# Patient Record
Sex: Female | Born: 1960 | Race: White | Hispanic: No | Marital: Married | Smoking: Never smoker
Health system: Southern US, Community
[De-identification: ages and names within clinical notes are randomized; demographics above are authoritative.]

## PROBLEM LIST (undated history)

## (undated) DIAGNOSIS — T7840XA Allergy, unspecified, initial encounter: Secondary | ICD-10-CM

## (undated) DIAGNOSIS — F32A Depression, unspecified: Secondary | ICD-10-CM

## (undated) DIAGNOSIS — F329 Major depressive disorder, single episode, unspecified: Secondary | ICD-10-CM

## (undated) HISTORY — DX: Allergy, unspecified, initial encounter: T78.40XA

## (undated) HISTORY — DX: Major depressive disorder, single episode, unspecified: F32.9

## (undated) HISTORY — DX: Depression, unspecified: F32.A

## (undated) MED FILL — Iron Sucrose Inj 20 MG/ML (Fe Equiv): INTRAVENOUS | Qty: 10 | Status: AC

---
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
## 1988-12-29 DIAGNOSIS — O99814 Abnormal glucose complicating childbirth: Secondary | ICD-10-CM | POA: Insufficient documentation

## 1999-12-30 HISTORY — PX: ABDOMINAL HYSTERECTOMY: SHX81

## 2008-09-07 DIAGNOSIS — E669 Obesity, unspecified: Secondary | ICD-10-CM | POA: Insufficient documentation

## 2008-09-07 DIAGNOSIS — E668 Other obesity: Secondary | ICD-10-CM | POA: Insufficient documentation

## 2009-05-26 DIAGNOSIS — J309 Allergic rhinitis, unspecified: Secondary | ICD-10-CM | POA: Insufficient documentation

## 2014-09-25 LAB — HM PAP SMEAR: HM Pap smear: NEGATIVE

## 2015-06-07 ENCOUNTER — Telehealth: Payer: Self-pay | Admitting: *Deleted

## 2015-06-07 NOTE — Telephone Encounter (Signed)
Opened in error

## 2015-06-15 ENCOUNTER — Other Ambulatory Visit: Payer: Self-pay | Admitting: Family Medicine

## 2015-06-15 DIAGNOSIS — F4322 Adjustment disorder with anxiety: Secondary | ICD-10-CM

## 2015-06-18 DIAGNOSIS — F4320 Adjustment disorder, unspecified: Secondary | ICD-10-CM | POA: Insufficient documentation

## 2015-06-19 ENCOUNTER — Telehealth: Payer: Self-pay | Admitting: Family Medicine

## 2015-06-19 NOTE — Telephone Encounter (Signed)
Pt called requesting refills. After letting the pt go I noticed Dr. Caryn Section approved refills on 06/18/15. I called pt back to advise. Thanks TNP

## 2015-09-26 ENCOUNTER — Ambulatory Visit (INDEPENDENT_AMBULATORY_CARE_PROVIDER_SITE_OTHER): Payer: BLUE CROSS/BLUE SHIELD | Admitting: Family Medicine

## 2015-09-26 ENCOUNTER — Encounter: Payer: Self-pay | Admitting: Family Medicine

## 2015-09-26 VITALS — BP 134/76 | HR 84 | Temp 98.2°F | Resp 16 | Ht 62.0 in | Wt 257.0 lb

## 2015-09-26 DIAGNOSIS — Z23 Encounter for immunization: Secondary | ICD-10-CM | POA: Diagnosis not present

## 2015-09-26 DIAGNOSIS — Z0000 Encounter for general adult medical examination without abnormal findings: Secondary | ICD-10-CM | POA: Diagnosis not present

## 2015-09-26 DIAGNOSIS — IMO0002 Reserved for concepts with insufficient information to code with codable children: Secondary | ICD-10-CM | POA: Insufficient documentation

## 2015-09-26 DIAGNOSIS — Z1211 Encounter for screening for malignant neoplasm of colon: Secondary | ICD-10-CM | POA: Diagnosis not present

## 2015-09-26 DIAGNOSIS — B029 Zoster without complications: Secondary | ICD-10-CM | POA: Insufficient documentation

## 2015-09-26 DIAGNOSIS — Z1239 Encounter for other screening for malignant neoplasm of breast: Secondary | ICD-10-CM

## 2015-09-26 DIAGNOSIS — R202 Paresthesia of skin: Secondary | ICD-10-CM | POA: Insufficient documentation

## 2015-09-26 LAB — POCT URINALYSIS DIPSTICK
Bilirubin, UA: NEGATIVE
Glucose, UA: NEGATIVE
Ketones, UA: NEGATIVE
Leukocytes, UA: NEGATIVE
NITRITE UA: NEGATIVE
PROTEIN UA: NEGATIVE
RBC UA: NEGATIVE
SPEC GRAV UA: 1.025
UROBILINOGEN UA: 0.2
pH, UA: 6

## 2015-09-26 NOTE — Patient Instructions (Signed)
Please call the [facility] at [facility] to schedule this at [phone]

## 2015-09-26 NOTE — Progress Notes (Signed)
Patient ID: Teresa Pitts, female   DOB: Oct 21, 1961, 54 y.o.   MRN: [id number]        Patient: Teresa Pitts, Female    DOB: 1961-10-09, 54 y.o.   MRN: [id number] Visit Date: 09/26/2015  Today's Provider: Margarita Rana, MD   Chief Complaint  Patient presents with  . Annual Exam   Subjective:    Annual physical exam [name] is a 54 y.o. female who presents today for health maintenance and complete physical. She feels well. She reports not exercising due to knee pain. She reports she is sleeping well.  Mood stable. A lot of family stress. Untrue information in daughter's EHR about patient, that is unclear how got there. Daughter going thru difficult divorce.    09/25/14 CPE 09/25/14 Pap-neg 05/03/12 EKG  Results for orders placed or performed in visit on 09/26/15  POCT urinalysis dipstick  Result Value Ref Range   Color, UA straw    Clarity, UA clear    Glucose, UA neg    Bilirubin, UA neg    Ketones, UA neg    Spec Grav, UA 1.025    Blood, UA neg    pH, UA 6.0    Protein, UA neg    Urobilinogen, UA 0.2    Nitrite, UA neg    Leukocytes, UA Negative Negative    -----------------------------------------------------------------   Review of Systems  Constitutional: Negative.   HENT: Negative.   Eyes: Negative.   Respiratory: Negative.   Cardiovascular: Negative.   Gastrointestinal: Negative.   Endocrine: Negative.   Genitourinary: Negative.   Musculoskeletal: Positive for joint swelling and arthralgias (knee).  Skin: Negative.   Allergic/Immunologic: Negative.   Neurological: Negative.   Hematological: Negative.   Psychiatric/Behavioral: Negative.     Social History She  reports that she has never smoked. She has never used smokeless tobacco. She reports that she does not drink alcohol or use illicit drugs. Social History   Social History  . Marital Status: Married    Spouse Name: N/A  . Number of Children: N/A  . Years of Education: N/A   Social History  Main Topics  . Smoking status: Never Smoker   . Smokeless tobacco: Never Used  . Alcohol Use: No  . Drug Use: No  . Sexual Activity: Not Asked   Other Topics Concern  . None   Social History Narrative    Patient Active Problem List   Diagnosis Date Noted  . Body mass index of 60 or higher 09/26/2015  . Burning or prickling sensation 09/26/2015  . Herpes zona 09/26/2015  . Adjustment reaction 06/18/2015  . Allergic rhinitis 05/26/2009  . Extreme obesity 09/07/2008  . Abnormal maternal glucose tolerance, with delivery 12/29/1988    Past Surgical History  Procedure Laterality Date  . Abdominal hysterectomy  2001  . Tonsillectomy and adenoidectomy  1980    Family History  Family Status  Relation Status Death Age  . Mother Alive   . Father Alive   . Brother Deceased     suicide  . Daughter Alive   . Son Alive    Her family history includes Cancer in her mother; Diabetes in her father; Hypertension in her father.    No Known Allergies  Previous Medications   IBUPROFEN (ADVIL,MOTRIN) 200 MG TABLET    Take by mouth as needed.   LORATADINE (CLARITIN) 10 MG CAPS    CLARITIN, 10MG (Oral Capsule)  1 Every Day, As Needed for 0 days  Quantity: 0.00;  Refills:  0   Ordered :04-Oct-2010  Edmonia James ;  Started 31-Aug-2008 Active Comments: Medication taken as needed. DX: 477.9   VENLAFAXINE XR (EFFEXOR-XR) 75 MG 24 HR CAPSULE    take 1 capsule by mouth once daily    Patient Care Team: Margarita Rana, MD as PCP - General (Family Medicine)     Objective:   Vitals: BP 134/76 mmHg  Pulse 84  Temp(Src) 98.2 F (36.8 C) (Oral)  Resp 16  Ht 5' 2"  (1.575 m)  Wt 257 lb (116.574 kg)  BMI 46.99 kg/m2   Physical Exam  Constitutional: She is oriented to person, place, and time. She appears well-developed and well-nourished.  HENT:  Head: Normocephalic and atraumatic.  Right Ear: Tympanic membrane, external ear and ear canal normal.  Left Ear: Tympanic membrane,  external ear and ear canal normal.  Nose: Nose normal.  Mouth/Throat: Uvula is midline, oropharynx is clear and moist and mucous membranes are normal.  Eyes: Conjunctivae, EOM and lids are normal. Pupils are equal, round, and reactive to light.  Neck: Trachea normal and normal range of motion. Neck supple. Carotid bruit is not present. No thyroid mass and no thyromegaly present.  Cardiovascular: Normal rate, regular rhythm and normal heart sounds.   Pulmonary/Chest: Effort normal and breath sounds normal.  Abdominal: Soft. Normal appearance and bowel sounds are normal. There is no hepatosplenomegaly. There is no tenderness.  Genitourinary: No breast swelling, tenderness or discharge.  Musculoskeletal: Normal range of motion.  Lymphadenopathy:    She has no cervical adenopathy.    She has no axillary adenopathy.  Neurological: She is alert and oriented to person, place, and time. She has normal strength. No cranial nerve deficit.  Skin: Skin is warm, dry and intact.  Psychiatric: She has a normal mood and affect. Her speech is normal and behavior is normal. Judgment and thought content normal. Cognition and memory are normal.     Depression Screen PHQ 2/9 Scores 09/26/2015  PHQ - 2 Score 0   History  Alcohol Use No    Assessment & Plan:     Routine Health Maintenance and Physical Exam  Exercise Activities and Dietary recommendations Goals    . Exercise 150 minutes per week (moderate activity)       Immunization History  Administered Date(s) Administered  . Influenza,inj,Quad PF,36+ Mos 09/26/2015  . Tdap 09/03/2007    Health Maintenance  Topic Date Due  . Hepatitis C Screening  07/[birth year]/1962  . HIV Screening  10/31/1977  . TETANUS/TDAP  10/31/1981  . PAP SMEAR  11/01/1983  . MAMMOGRAM  10/31/2012  . COLONOSCOPY  10/31/2012  . INFLUENZA VACCINE  07/30/2015   1. Annual physical exam Healthy. Increase exercise and check labs as below.   - POCT urinalysis dipstick -  Comprehensive metabolic panel - CBC with Differential/Platelet - Hemoglobin A1c - Lipid Panel With LDL/HDL Ratio - TSH  2. Breast cancer screening Will schedule.  - MM DIGITAL SCREENING BILATERAL; Future  3. Colon cancer screening Will refer.  - Ambulatory referral to Gastroenterology  4. Need for influenza vaccination Stable.  - Flu Vaccine QUAD 36+ mos IM   Patient was seen and examined by Jerrell Belfast, MD, and note scribed by Lynford Humphrey, [place].   I have reviewed the document for accuracy and completeness and I agree with above. Jerrell Belfast, MD    Margarita Rana, MD     --------------------------------------------------------------------

## 2015-11-26 DIAGNOSIS — M170 Bilateral primary osteoarthritis of knee: Secondary | ICD-10-CM | POA: Insufficient documentation

## 2015-12-11 ENCOUNTER — Other Ambulatory Visit: Payer: Self-pay | Admitting: Family Medicine

## 2015-12-11 DIAGNOSIS — F4322 Adjustment disorder with anxiety: Secondary | ICD-10-CM

## 2016-06-07 ENCOUNTER — Other Ambulatory Visit: Payer: Self-pay | Admitting: Family Medicine

## 2016-06-07 DIAGNOSIS — F4322 Adjustment disorder with anxiety: Secondary | ICD-10-CM

## 2016-06-11 ENCOUNTER — Other Ambulatory Visit: Payer: Self-pay | Admitting: Family Medicine

## 2016-06-11 DIAGNOSIS — F4322 Adjustment disorder with anxiety: Secondary | ICD-10-CM

## 2016-06-11 MED ORDER — VENLAFAXINE HCL ER 75 MG PO CP24: 75.0000 mg | ORAL_CAPSULE | Freq: Every day | ORAL | Status: DC

## 2016-06-11 NOTE — Telephone Encounter (Signed)
Pt needs refill for her venlafaxine XR (EFFEXOR-XR) 75 MG 24 hr capsule  She uses [facility]  Her call back is [phone]  She has an appt. With Sonia Baller Sept. 29th for CPE  Thanks teri

## 2016-09-26 ENCOUNTER — Encounter: Payer: BLUE CROSS/BLUE SHIELD | Admitting: Physician Assistant

## 2016-10-03 ENCOUNTER — Encounter: Payer: BLUE CROSS/BLUE SHIELD | Admitting: Physician Assistant

## 2017-01-01 ENCOUNTER — Ambulatory Visit (INDEPENDENT_AMBULATORY_CARE_PROVIDER_SITE_OTHER): Payer: BLUE CROSS/BLUE SHIELD | Admitting: Physician Assistant

## 2017-01-01 ENCOUNTER — Encounter: Payer: Self-pay | Admitting: Physician Assistant

## 2017-01-01 VITALS — BP 120/80 | HR 74 | Temp 98.6°F | Resp 16 | Ht 62.5 in | Wt 246.0 lb

## 2017-01-01 DIAGNOSIS — M7712 Lateral epicondylitis, left elbow: Secondary | ICD-10-CM | POA: Insufficient documentation

## 2017-01-01 DIAGNOSIS — Z114 Encounter for screening for human immunodeficiency virus [HIV]: Secondary | ICD-10-CM

## 2017-01-01 DIAGNOSIS — Z1211 Encounter for screening for malignant neoplasm of colon: Secondary | ICD-10-CM

## 2017-01-01 DIAGNOSIS — M67449 Ganglion, unspecified hand: Secondary | ICD-10-CM | POA: Insufficient documentation

## 2017-01-01 DIAGNOSIS — Z0000 Encounter for general adult medical examination without abnormal findings: Secondary | ICD-10-CM

## 2017-01-01 DIAGNOSIS — J3089 Other allergic rhinitis: Secondary | ICD-10-CM | POA: Diagnosis not present

## 2017-01-01 DIAGNOSIS — F4322 Adjustment disorder with anxiety: Secondary | ICD-10-CM

## 2017-01-01 DIAGNOSIS — Z1231 Encounter for screening mammogram for malignant neoplasm of breast: Secondary | ICD-10-CM | POA: Diagnosis not present

## 2017-01-01 DIAGNOSIS — N3941 Urge incontinence: Secondary | ICD-10-CM | POA: Insufficient documentation

## 2017-01-01 DIAGNOSIS — Z1159 Encounter for screening for other viral diseases: Secondary | ICD-10-CM

## 2017-01-01 DIAGNOSIS — Z1239 Encounter for other screening for malignant neoplasm of breast: Secondary | ICD-10-CM

## 2017-01-01 MED ORDER — VENLAFAXINE HCL ER 75 MG PO CP24: 75.0000 mg | ORAL_CAPSULE | Freq: Every day | ORAL | 5 refills | Status: DC

## 2017-01-01 NOTE — Patient Instructions (Signed)

## 2017-01-01 NOTE — Progress Notes (Signed)
Patient: Teresa Pitts, Female    DOB: Jul 14, 1961, 56 y.o.   MRN: [id number] Visit Date: 01/01/2017  Today's Provider: Mar Daring, PA-C   Chief Complaint  Patient presents with  . Annual Exam   Subjective:    Annual physical exam Teresa Pitts is a 56 y.o. female who presents today for health maintenance and complete physical. She feels well. She reports exercising none. She reports she is sleeping well.  09/25/14 Pap-neg Mammogram: overdue Colonoscopy: Never  Patient also has acute complaints of leftt elbow and forearm pain. 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 She has not done anything for this yet. She also mentions having increasing urinary frequency with urge incontinence. This has been worsening over the last couple of months as well. She also has a bump on the distal phalanx of the thumb on the right hand. She noticed this a couple of months back. Occasionally hurts or becomes more swollen if she bumps it. It is also starting to affect the nail growing out, now has a flat ridge in the area where the bump is located more proximally. She also has chronic knee pain from OA and is starting an exercise program for weight loss. She has seen ortho at [facility] for this in the past but was told she is not a candidate for any procedure unless she lost 40 pounds.  -----------------------------------------------------------------   Review of Systems  Constitutional: Negative.   HENT: Negative.   Eyes: Negative.   Respiratory: Negative.   Cardiovascular: Negative.   Gastrointestinal: Negative.   Endocrine: Negative.   Genitourinary: Negative.   Musculoskeletal: Positive for arthralgias.       Arm pain and knee pain  Skin: Negative.        Lesion on right thumb times several weeks  Allergic/Immunologic: Negative.   Neurological: Negative.   Hematological: Negative.   Psychiatric/Behavioral: Negative.     Social  History      She  reports that she has never smoked. She has never used smokeless tobacco. She reports that she does not drink alcohol or use drugs.       Social History   Social History  . Marital status: Married    Spouse name: N/A  . Number of children: 2  . Years of education: N/A   Social History Main Topics  . Smoking status: Never Smoker  . Smokeless tobacco: Never Used  . Alcohol use No  . Drug use: No  . Sexual activity: Not Asked   Other Topics Concern  . None   Social History Narrative  . None    Past Medical History:  Diagnosis Date  . Allergy   . Depression      Patient Active Problem List   Diagnosis Date Noted  . Primary osteoarthritis of both knees 11/26/2015  . Body mass index of 60 or higher 09/26/2015  . Burning or prickling sensation 09/26/2015  . Herpes zona 09/26/2015  . Adjustment reaction 06/18/2015  . Allergic rhinitis 05/26/2009  . Extreme obesity ([place]) 09/07/2008  . Abnormal maternal glucose tolerance, with delivery 12/29/1988    Past Surgical History:  Procedure Laterality Date  . ABDOMINAL HYSTERECTOMY  2001  . TONSILLECTOMY AND ADENOIDECTOMY  1980    Family History        Family Status  Relation Status  . Mother Alive  . Father Alive  . Brother Deceased   suicide  .  Daughter Alive  . Son Alive        Her family history includes Cancer in her mother; Diabetes in her father; Hypertension in her father.     No Known Allergies   Current Outpatient Prescriptions:  .  ibuprofen (ADVIL,MOTRIN) 200 MG tablet, Take by mouth as needed., Disp: , Rfl:  .  Loratadine (CLARITIN) 10 MG CAPS, CLARITIN, 10MG (Oral Capsule)  1 Every Day, As Needed for 0 days  Quantity: 0.00;  Refills: 0   Ordered :04-Oct-2010  Edmonia James ;  Started 31-Aug-2008 Active Comments: Medication taken as needed. DX: 477.9, Disp: , Rfl:  .  venlafaxine XR (EFFEXOR-XR) 75 MG 24 hr capsule, Take 1 capsule (75 mg total) by mouth daily., Disp: 30 capsule,  Rfl: 3   Patient Care Team: Mar Daring, PA-C as PCP - General (Family Medicine)      Objective:   Vitals: BP 120/80 (BP Location: Right Arm, Patient Position: Sitting, Cuff Size: Large)   Pulse 74   Temp 98.6 F (37 C) (Oral)   Resp 16   Ht 5' 2.5" (1.588 m)   Wt 246 lb (111.6 kg)   BMI 44.28 kg/m    Physical Exam  Constitutional: She is oriented to person, place, and time. She appears well-developed and well-nourished. No distress.  HENT:  Head: Normocephalic and atraumatic.  Right Ear: Hearing, tympanic membrane, external ear and ear canal normal.  Left Ear: Hearing, tympanic membrane, external ear and ear canal normal.  Nose: Nose normal.  Mouth/Throat: Uvula is midline, oropharynx is clear and moist and mucous membranes are normal. No oropharyngeal exudate.  Eyes: Conjunctivae and EOM are normal. Pupils are equal, round, and reactive to light. Right eye exhibits no discharge. Left eye exhibits no discharge. No scleral icterus.  Neck: Normal range of motion. Neck supple. No JVD present. Carotid bruit is not present. No tracheal deviation present. No thyromegaly present.  Cardiovascular: Normal rate, regular rhythm, normal heart sounds and intact distal pulses.  Exam reveals no gallop and no friction rub.   No murmur heard. Pulmonary/Chest: Effort normal and breath sounds normal. No respiratory distress. She has no decreased breath sounds. She has no wheezes. She has no rales. She exhibits no tenderness. Right breast exhibits no inverted nipple, no mass, no nipple discharge, no skin change and no tenderness. Left breast exhibits no inverted nipple, no mass, no nipple discharge, no skin change and no tenderness. Breasts are symmetrical.  Abdominal: Soft. Bowel sounds are normal. She exhibits no distension and no mass. There is no tenderness. There is no rebound and no guarding.  Musculoskeletal: Normal range of motion. She exhibits no edema.       Right elbow: Normal.       Left elbow: She exhibits normal range of motion and no swelling. Tenderness found. Lateral epicondyle tenderness noted.       Hands: Lymphadenopathy:    She has no cervical adenopathy.  Neurological: She is alert and oriented to person, place, and time.  Skin: Skin is warm and dry. No rash noted. She is not diaphoretic.  Psychiatric: She has a normal mood and affect. Her behavior is normal. Judgment and thought content normal.  Vitals reviewed.    Depression Screen PHQ 2/9 Scores 01/01/2017 09/26/2015  PHQ - 2 Score 0 0      Assessment & Plan:     Routine Health Maintenance and Physical Exam  Exercise Activities and Dietary recommendations Goals    . Exercise 150 minutes  per week (moderate activity)       Immunization History  Administered Date(s) Administered  . Influenza Split 12/12/2010  . Influenza,inj,Quad PF,36+ Mos 09/25/2014, 09/26/2015  . Tdap 09/03/2007    Health Maintenance  Topic Date Due  . Hepatitis C Screening  July 19, 1961  . HIV Screening  10/31/1977  . MAMMOGRAM  10/31/2012  . COLONOSCOPY  10/31/2012  . INFLUENZA VACCINE  07/29/2016  . TETANUS/TDAP  09/02/2017  . PAP SMEAR  09/25/2017     Discussed health benefits of physical activity, and encouraged her to engage in regular exercise appropriate for her age and condition.    1. Annual physical exam Normal physical exam today. Will check labs as below and f/u pending lab results. If labs are stable and WNL she will not need to have these rechecked for one year at her next annual physical exam. She is to call the office in the meantime if she has any acute issue, questions or concerns. - Comprehensive metabolic panel - Lipid Panel With LDL/HDL Ratio - Hemoglobin A1c  2. Breast cancer screening Breast exam today was normal. There is no family history of breast cancer. She does perform regular self breast exams. Mammogram was ordered as below. Information for [facility] was given to  patient so she may schedule her mammogram at her convenience. - MM DIGITAL SCREENING BILATERAL; Future  3. Colon cancer screening No previous colonoscopy. Referral placed as below.  - Ambulatory referral to Gastroenterology  4. Acute allergic rhinitis due to other allergen, unspecified seasonality Will check labs as below and f/u pending results. - CBC with Differential/Platelet  5. Adjustment disorder with anxious mood Stable. Diagnosis pulled for medication refill. Continue current medical treatment plan. Will check labs as below and f/u pending results. - TSH - venlafaxine XR (EFFEXOR-XR) 75 MG 24 hr capsule; Take 1 capsule (75 mg total) by mouth daily.  Dispense: 30 capsule; Refill: 5  6. Need for hepatitis C screening test - Hepatitis C antibody  7. Screening for HIV (human immunodeficiency virus) - HIV antibody (with reflex)  8. Digital mucous cyst Will refer to orthopedics since it is affecting the nail bed and occasionally causes pain for further evaluation.  - Ambulatory referral to Orthopedic Surgery  9. Urge incontinence of urine Discussed time toileting with trying to use the restroom every 2 hours. If this does not help may consider pelvic floor therapy as discussed in office visit.  10. Left lateral epicondylitis IBU 654m TID or aleve 4490mBID (patient choice) OTC, rest, ice, heat, stretches shown to patient, elbow strap. She is to call if no improvements.  --------------------------------------------------------------------    JeMar DaringPA-C  [facility]

## 2017-01-02 ENCOUNTER — Telehealth: Payer: Self-pay

## 2017-01-02 ENCOUNTER — Encounter: Payer: Self-pay | Admitting: Physician Assistant

## 2017-01-02 DIAGNOSIS — R7303 Prediabetes: Secondary | ICD-10-CM | POA: Insufficient documentation

## 2017-01-02 DIAGNOSIS — E7800 Pure hypercholesterolemia, unspecified: Secondary | ICD-10-CM | POA: Insufficient documentation

## 2017-01-02 LAB — COMPREHENSIVE METABOLIC PANEL
ALBUMIN: 4.4 g/dL (ref 3.5–5.5)
ALK PHOS: 55 IU/L (ref 39–117)
ALT: 24 IU/L (ref 0–32)
AST: 23 IU/L (ref 0–40)
Albumin/Globulin Ratio: 1.7 (ref 1.2–2.2)
BUN/Creatinine Ratio: 14 (ref 9–23)
BUN: 10 mg/dL (ref 6–24)
Bilirubin Total: 0.4 mg/dL (ref 0.0–1.2)
CHLORIDE: 100 mmol/L (ref 96–106)
CO2: 25 mmol/L (ref 18–29)
CREATININE: 0.73 mg/dL (ref 0.57–1.00)
Calcium: 9.4 mg/dL (ref 8.7–10.2)
GFR calc Af Amer: 108 mL/min/{1.73_m2} (ref >59)
GFR calc non Af Amer: 94 mL/min/{1.73_m2} (ref >59)
Globulin, Total: 2.6 g/dL (ref 1.5–4.5)
Glucose: 91 mg/dL (ref 65–99)
Potassium: 4.8 mmol/L (ref 3.5–5.2)
Sodium: 142 mmol/L (ref 134–144)
Total Protein: 7 g/dL (ref 6.0–8.5)

## 2017-01-02 LAB — CBC WITH DIFFERENTIAL/PLATELET
BASOS ABS: 0 10*3/uL (ref 0.0–0.2)
Basos: 0 %
EOS (ABSOLUTE): 0.2 10*3/uL (ref 0.0–0.4)
Eos: 3 %
HEMOGLOBIN: 14.3 g/dL (ref 11.1–15.9)
Hematocrit: 41.9 % (ref 34.0–46.6)
Immature Grans (Abs): 0 10*3/uL (ref 0.0–0.1)
Immature Granulocytes: 0 %
Lymphocytes Absolute: 1.5 10*3/uL (ref 0.7–3.1)
Lymphs: 27 %
MCH: 28.6 pg (ref 26.6–33.0)
MCHC: 34.1 g/dL (ref 31.5–35.7)
MCV: 84 fL (ref 79–97)
MONOCYTES: 5 %
Monocytes Absolute: 0.3 10*3/uL (ref 0.1–0.9)
Neutrophils Absolute: 3.6 10*3/uL (ref 1.4–7.0)
Neutrophils: 65 %
Platelets: 226 10*3/uL (ref 150–379)
RBC: 5 x10E6/uL (ref 3.77–5.28)
RDW: 13.3 % (ref 12.3–15.4)
WBC: 5.5 10*3/uL (ref 3.4–10.8)

## 2017-01-02 LAB — HEMOGLOBIN A1C
ESTIMATED AVERAGE GLUCOSE: 117 mg/dL
HEMOGLOBIN A1C: 5.7 % — AB (ref 4.8–5.6)

## 2017-01-02 LAB — LIPID PANEL WITH LDL/HDL RATIO
Cholesterol, Total: 265 mg/dL — ABNORMAL HIGH (ref 100–199)
HDL: 92 mg/dL (ref >39)
LDL Calculated: 153 mg/dL — ABNORMAL HIGH (ref 0–99)
LDL/HDL RATIO: 1.7 ratio (ref 0.0–3.2)
Triglycerides: 101 mg/dL (ref 0–149)
VLDL Cholesterol Cal: 20 mg/dL (ref 5–40)

## 2017-01-02 LAB — HEPATITIS C ANTIBODY

## 2017-01-02 LAB — TSH: TSH: 2.35 u[IU]/mL (ref 0.450–4.500)

## 2017-01-02 LAB — HIV ANTIBODY (ROUTINE TESTING W REFLEX): HIV SCREEN 4TH GENERATION: NONREACTIVE

## 2017-01-02 NOTE — Telephone Encounter (Signed)
-----   Message from Mar Daring, [place] sent at 01/02/2017  8:46 AM EST ----- Cholesterol is elevated but HDL is high also offering cardioprotection. ASCVD risk over a 10 yr period is low at 1.29% so no need to start cholesterol lowering medication at this time. Work on lifestyle modifications including healthy dieting (limiting fats and carbs) and increasing physical activity. Fish oil 1228m or mega red may help lower cholesterol some also along with lifestyle changes. All other labs are stable and WNL.

## 2017-01-02 NOTE — Telephone Encounter (Signed)
Advised pt of lab results. Pt verbally acknowledges understanding. Kym Scannell Drozdowski, CMA   

## 2017-01-06 ENCOUNTER — Encounter: Payer: BLUE CROSS/BLUE SHIELD | Admitting: Physician Assistant

## 2017-01-14 ENCOUNTER — Other Ambulatory Visit: Payer: Self-pay

## 2017-01-14 ENCOUNTER — Telehealth: Payer: Self-pay

## 2017-01-14 NOTE — Telephone Encounter (Signed)
Gastroenterology Pre-Procedure Review  Request Date:  Requesting Physician: Dr.   PATIENT REVIEW QUESTIONS: The patient responded to the following health history questions as indicated:    1. Are you having any GI issues? no 2. Do you have a personal history of Polyps? no 3. Do you have a family history of Colon Cancer or Polyps? no 4. Diabetes Mellitus? no 5. Joint replacements in the past 12 months?no 6. Major health problems in the past 3 months?no 7. Any artificial heart valves, MVP, or defibrillator?no    MEDICATIONS & ALLERGIES:    Patient reports the following regarding taking any anticoagulation/antiplatelet therapy:   Plavix, Coumadin, Eliquis, Xarelto, Lovenox, Pradaxa, Brilinta, or Effient? no Aspirin? no  Patient confirms/reports the following medications:  Current Outpatient Prescriptions  Medication Sig Dispense Refill  . ibuprofen (ADVIL,MOTRIN) 200 MG tablet Take by mouth as needed.    . Loratadine (CLARITIN) 10 MG CAPS CLARITIN, 10MG (Oral Capsule)  1 Every Day, As Needed for 0 days  Quantity: 0.00;  Refills: 0   Ordered :04-Oct-2010  Teresa Pitts ;  Started 31-Aug-2008 Active Comments: Medication taken as needed. DX: 477.9    . venlafaxine XR (EFFEXOR-XR) 75 MG 24 hr capsule Take 1 capsule (75 mg total) by mouth daily. 30 capsule 5   No current facility-administered medications for this visit.     Patient confirms/reports the following allergies:  No Known Allergies  No orders of the defined types were placed in this encounter.   AUTHORIZATION INFORMATION Primary Insurance: 1D#: Group #:  Secondary Insurance: 1D#: Group #:  SCHEDULE INFORMATION: Date: 02/27/17 Time: Location: [place]

## 2017-02-25 ENCOUNTER — Telehealth: Payer: Self-pay | Admitting: Gastroenterology

## 2017-02-25 NOTE — Telephone Encounter (Signed)
Patient left a voice message that she needs to reschedule her colonoscopy. Please call

## 2017-02-26 ENCOUNTER — Encounter: Payer: Self-pay | Admitting: *Deleted

## 2017-02-26 NOTE — Telephone Encounter (Signed)
Called pt and left message to callback for reschedule of procedure.

## 2017-02-27 ENCOUNTER — Ambulatory Visit
Admission: RE | Admit: 2017-02-27 | Payer: BLUE CROSS/BLUE SHIELD | Source: Ambulatory Visit | Admitting: Gastroenterology

## 2017-02-27 ENCOUNTER — Encounter: Admission: RE | Payer: Self-pay | Source: Ambulatory Visit

## 2017-02-27 SURGERY — COLONOSCOPY WITH PROPOFOL
Anesthesia: General

## 2017-05-27 ENCOUNTER — Encounter: Payer: Self-pay | Admitting: Physician Assistant

## 2017-05-27 ENCOUNTER — Ambulatory Visit (INDEPENDENT_AMBULATORY_CARE_PROVIDER_SITE_OTHER): Payer: BLUE CROSS/BLUE SHIELD | Admitting: Physician Assistant

## 2017-05-27 VITALS — BP 102/60 | HR 83 | Temp 98.2°F | Resp 16 | Wt 258.8 lb

## 2017-05-27 DIAGNOSIS — K5732 Diverticulitis of large intestine without perforation or abscess without bleeding: Secondary | ICD-10-CM

## 2017-05-27 DIAGNOSIS — R197 Diarrhea, unspecified: Secondary | ICD-10-CM

## 2017-05-27 DIAGNOSIS — R10817 Generalized abdominal tenderness: Secondary | ICD-10-CM

## 2017-05-27 MED ORDER — DICYCLOMINE HCL 10 MG PO CAPS: 10.0000 mg | ORAL_CAPSULE | Freq: Three times a day (TID) | ORAL | 0 refills | Status: DC

## 2017-05-27 NOTE — Patient Instructions (Signed)
Bland Diet A bland diet consists of foods that do not have a lot of fat or fiber. Foods without fat or fiber are easier for the body to digest. They are also less likely to irritate your mouth, throat, stomach, and other parts of your gastrointestinal tract. A bland diet is sometimes called a BRAT diet. What is my plan? Your health care provider or dietitian may recommend specific changes to your diet to prevent and treat your symptoms, such as:  Eating small meals often.  Cooking food until it is soft enough to chew easily.  Chewing your food well.  Drinking fluids slowly.  Not eating foods that are very spicy, sour, or fatty.  Not eating citrus fruits, such as oranges and grapefruit. What do I need to know about this diet?  Eat a variety of foods from the bland diet food list.  Do not follow a bland diet longer than you have to.  Ask your health care provider whether you should take vitamins. What foods can I eat? Grains   Hot cereals, such as cream of wheat. Bread, crackers, or tortillas made from refined white flour. Rice. Vegetables  Canned or cooked vegetables. Mashed or boiled potatoes. Fruits  Bananas. Applesauce. Other types of cooked or canned fruit with the skin and seeds removed, such as canned peaches or pears. Meats and Other Protein Sources  Scrambled eggs. Creamy peanut butter or other nut butters. Lean, well-cooked meats, such as chicken or fish. Tofu. Soups or broths. Dairy  Low-fat dairy products, such as milk, cottage cheese, or yogurt. Beverages  Water. Herbal tea. Apple juice. Sweets and Desserts  Pudding. Custard. Fruit gelatin. Ice cream. Fats and Oils  Mild salad dressings. Canola or olive oil. The items listed above may not be a complete list of allowed foods or beverages. Contact your dietitian for more options.  What foods are not recommended? Foods and ingredients that are often not recommended include:  Spicy foods, such as hot sauce or  salsa.  Fried foods.  Sour foods, such as pickled or fermented foods.  Raw vegetables or fruits, especially citrus or berries.  Caffeinated drinks.  Alcohol.  Strongly flavored seasonings or condiments. The items listed above may not be a complete list of foods and beverages that are not allowed. Contact your dietitian for more information.  This information is not intended to replace advice given to you by your health care provider. Make sure you discuss any questions you have with your health care provider. Document Released: 04/07/2016 Document Revised: 05/22/2016 Document Reviewed: 12/27/2014 Elsevier Interactive Patient Education  2017 [employer].

## 2017-05-27 NOTE — Progress Notes (Signed)
Patient: Teresa Pitts Female    DOB: 11/02/1961   56 y.o.   MRN: [id number] Visit Date: 05/27/2017  Today's Provider: Mar Daring, PA-C   Chief Complaint  Patient presents with  . Diarrhea   Subjective:    Diarrhea   This is a new problem. The current episode started 1 to 4 weeks ago (for three weeks). The problem occurs 5 to 10 times per day (Depending on what she eats). The problem has been unchanged. The stool consistency is described as watery. Associated symptoms include bloating. Pertinent negatives include no coughing, fever, headaches, increased  flatus, vomiting or weight loss. Associated symptoms comments: Associated Symptoms:Cramping sometimes, fatigue. Dizzy and nauseas. Exacerbated by: "anything"  There are no known risk factors. She has tried bismuth subsalicylate for the symptoms. The treatment provided no relief.  She reports that her stomach feels better after she eats, but then starts to bother her.    No Known Allergies   Current Outpatient Prescriptions:  .  ibuprofen (ADVIL,MOTRIN) 200 MG tablet, Take by mouth as needed., Disp: , Rfl:  .  Loratadine (CLARITIN) 10 MG CAPS, CLARITIN, 10MG (Oral Capsule)  1 Every Day, As Needed for 0 days  Quantity: 0.00;  Refills: 0   Ordered :04-Oct-2010  Edmonia James ;  Started 31-Aug-2008 Active Comments: Medication taken as needed. DX: 477.9, Disp: , Rfl:  .  venlafaxine XR (EFFEXOR-XR) 75 MG 24 hr capsule, Take 1 capsule (75 mg total) by mouth daily., Disp: 30 capsule, Rfl: 5  Review of Systems  Constitutional: Positive for fatigue. Negative for fever and weight loss.  Respiratory: Negative for cough.   Cardiovascular: Negative for chest pain, palpitations and leg swelling.  Gastrointestinal: Positive for abdominal distention, bloating, diarrhea and nausea. Negative for blood in stool, constipation, flatus, rectal pain and vomiting.  Genitourinary: Negative.   Musculoskeletal: Positive for back pain.    Neurological: Positive for dizziness. Negative for light-headedness and headaches.    Social History  Substance Use Topics  . Smoking status: Never Smoker  . Smokeless tobacco: Never Used  . Alcohol use No   Objective:   BP 102/60 (BP Location: Right Wrist, Patient Position: Sitting, Cuff Size: Normal)   Pulse 83   Temp 98.2 F (36.8 C) (Oral)   Resp 16   Wt 258 lb 12.8 oz (117.4 kg)   SpO2 97%   BMI 46.58 kg/m    Physical Exam  Constitutional: She appears well-developed and well-nourished. No distress.  HENT:  Head: Normocephalic and atraumatic.  Right Ear: Hearing, tympanic membrane, external ear and ear canal normal.  Left Ear: Hearing, tympanic membrane, external ear and ear canal normal.  Nose: Nose normal.  Mouth/Throat: Uvula is midline, oropharynx is clear and moist and mucous membranes are normal. No oropharyngeal exudate.  Eyes: Conjunctivae are normal. Pupils are equal, round, and reactive to light. Right eye exhibits no discharge. Left eye exhibits no discharge. No scleral icterus.  Neck: Normal range of motion. Neck supple. No tracheal deviation present. No thyromegaly present.  Cardiovascular: Normal rate, regular rhythm and normal heart sounds.  Exam reveals no gallop and no friction rub.   No murmur heard. Pulmonary/Chest: Effort normal and breath sounds normal. No stridor. No respiratory distress. She has no wheezes. She has no rales.  Abdominal: Normal appearance. She exhibits no ascites and no mass. Bowel sounds are increased. There is generalized tenderness. There is no rigidity, no rebound, no guarding and no CVA tenderness. No hernia.  Lymphadenopathy:    She has no cervical adenopathy.  Skin: Skin is warm and dry. She is not diaphoretic.  Vitals reviewed.       Assessment & Plan:     1. Diarrhea, unspecified type Suspect functional diarrhea vs viral gastroenteritis as the most likely causes. Advised to follow bland diet and use immodium prn.  Bentyl given as below for cramping and to see if it lessens diarrhea symptoms. Will check labs as below and obtain stool cultures as below. No recent travel, camping or exposure to anyone with similar symptoms. I will f/u pending results. Will refer to GI if all testing is normal. I will see her back in 1-2 weeks to recheck. She is to call if symptoms change or worsen in the meantime.  - CBC w/Diff/Platelet - Stool Culture - Ova and parasite examination - DG Abd 2 Views; Future - dicyclomine (BENTYL) 10 MG capsule; Take 1 capsule (10 mg total) by mouth 4 (four) times daily -  before meals and at bedtime.  Dispense: 40 capsule; Refill: 0 - Stool C-Diff Toxin Assay  2. Generalized abdominal tenderness without rebound tenderness See above medical treatment plan. - dicyclomine (BENTYL) 10 MG capsule; Take 1 capsule (10 mg total) by mouth 4 (four) times daily -  before meals and at bedtime.  Dispense: 40 capsule; Refill: 0 - Stool C-Diff Toxin Assay       Mar Daring, PA-C  [facility]

## 2017-05-28 ENCOUNTER — Telehealth: Payer: Self-pay

## 2017-05-28 LAB — CBC WITH DIFFERENTIAL/PLATELET
BASOS ABS: 0 10*3/uL (ref 0.0–0.2)
Basos: 0 %
EOS (ABSOLUTE): 0.1 10*3/uL (ref 0.0–0.4)
Eos: 2 %
HEMATOCRIT: 39.4 % (ref 34.0–46.6)
Hemoglobin: 13.1 g/dL (ref 11.1–15.9)
Immature Grans (Abs): 0.1 10*3/uL (ref 0.0–0.1)
Immature Granulocytes: 2 %
LYMPHS ABS: 1.9 10*3/uL (ref 0.7–3.1)
Lymphs: 36 %
MCH: 27.2 pg (ref 26.6–33.0)
MCHC: 33.2 g/dL (ref 31.5–35.7)
MCV: 82 fL (ref 79–97)
MONOS ABS: 0.5 10*3/uL (ref 0.1–0.9)
Monocytes: 9 %
Neutrophils Absolute: 2.7 10*3/uL (ref 1.4–7.0)
Neutrophils: 51 %
Platelets: 226 10*3/uL (ref 150–379)
RBC: 4.81 x10E6/uL (ref 3.77–5.28)
RDW: 13.1 % (ref 12.3–15.4)
WBC: 5.3 10*3/uL (ref 3.4–10.8)

## 2017-05-28 NOTE — Telephone Encounter (Signed)
Advised pt of lab results. Pt verbally acknowledges understanding. Emily Drozdowski, CMA   

## 2017-05-28 NOTE — Telephone Encounter (Signed)
-----   Message from Mar Daring, [place] sent at 05/28/2017  8:25 AM EDT ----- No signs of infection noted with changes in WBC. It is stable and WNL. Will await other results.

## 2017-06-03 ENCOUNTER — Telehealth: Payer: Self-pay

## 2017-06-03 LAB — STOOL CULTURE: E coli, Shiga toxin Assay: NEGATIVE

## 2017-06-03 LAB — OVA AND PARASITE EXAMINATION

## 2017-06-03 LAB — CLOSTRIDIUM DIFFICILE EIA: C difficile Toxins A+B, EIA: NEGATIVE

## 2017-06-03 NOTE — Telephone Encounter (Signed)
-----   Message from Mar Daring, [place] sent at 06/03/2017 12:59 PM EDT ----- All stool cultures have been negative. Can we see if she is still having diarrhea.

## 2017-06-03 NOTE — Telephone Encounter (Signed)
Yes I would get that next.

## 2017-06-03 NOTE — Telephone Encounter (Signed)
Patient advised as directed below. Per patient she is going to go get xray Thursday or Friday.

## 2017-06-03 NOTE — Telephone Encounter (Signed)
Patient advised as directed below. She reports still having diarrhea. She is asking if she still need to get the xray?

## 2017-06-05 ENCOUNTER — Ambulatory Visit
Admission: RE | Admit: 2017-06-05 | Discharge: 2017-06-05 | Disposition: A | Discharge status: Home / Self Care | Payer: BLUE CROSS/BLUE SHIELD | Source: Ambulatory Visit | Attending: Physician Assistant | Admitting: Physician Assistant

## 2017-06-05 DIAGNOSIS — R197 Diarrhea, unspecified: Secondary | ICD-10-CM | POA: Insufficient documentation

## 2017-06-09 ENCOUNTER — Telehealth: Payer: Self-pay

## 2017-06-09 DIAGNOSIS — R197 Diarrhea, unspecified: Secondary | ICD-10-CM

## 2017-06-09 DIAGNOSIS — Z1211 Encounter for screening for malignant neoplasm of colon: Secondary | ICD-10-CM

## 2017-06-09 DIAGNOSIS — R1032 Left lower quadrant pain: Secondary | ICD-10-CM

## 2017-06-09 NOTE — Telephone Encounter (Signed)
I would recommend CT abd/pelvis to evaluate for diverticulitis. Is she having fever?   I will order CT.

## 2017-06-09 NOTE — Telephone Encounter (Signed)
Patient called stating she continues to have problems with diarrhea.  Now also having issues with constipation and pain in her lower left quadrant.   Does not know if she needs to be seen, get referral or other. CB # [phone]

## 2017-06-09 NOTE — Telephone Encounter (Signed)
Patient advised as below. Patient verbalizes understanding and is in agreement with treatment plan. Patient wants to know if you recommend her having a colonoscopy, reports she has not had one done.

## 2017-06-10 NOTE — Telephone Encounter (Signed)
Patient advised and agrees with recommendation.

## 2017-06-10 NOTE — Telephone Encounter (Signed)
Noted  

## 2017-06-10 NOTE — Telephone Encounter (Signed)
Yes she should. I can order GI referral as well.  I will say if she ends up having diverticulitis she will not be able to get a colonoscopy until treated and symptom free for normally 8 weeks at minimum.

## 2017-06-12 ENCOUNTER — Telehealth: Payer: Self-pay

## 2017-06-12 ENCOUNTER — Ambulatory Visit
Admission: RE | Admit: 2017-06-12 | Discharge: 2017-06-12 | Disposition: A | Discharge status: Home / Self Care | Payer: BLUE CROSS/BLUE SHIELD | Source: Ambulatory Visit | Attending: Physician Assistant | Admitting: Physician Assistant

## 2017-06-12 DIAGNOSIS — R1032 Left lower quadrant pain: Secondary | ICD-10-CM | POA: Diagnosis present

## 2017-06-12 DIAGNOSIS — K5732 Diverticulitis of large intestine without perforation or abscess without bleeding: Secondary | ICD-10-CM | POA: Insufficient documentation

## 2017-06-12 DIAGNOSIS — K760 Fatty (change of) liver, not elsewhere classified: Secondary | ICD-10-CM | POA: Diagnosis not present

## 2017-06-12 DIAGNOSIS — R197 Diarrhea, unspecified: Secondary | ICD-10-CM | POA: Diagnosis not present

## 2017-06-12 MED ORDER — SULFAMETHOXAZOLE-TRIMETHOPRIM 800-160 MG PO TABS: 1.0000 | ORAL_TABLET | Freq: Two times a day (BID) | ORAL | 0 refills | Status: DC

## 2017-06-12 NOTE — Telephone Encounter (Signed)
-----   Message from Mar Daring, [place] sent at 06/12/2017 11:26 AM EDT ----- There is some diverticulitis noted. Will treat with antibiotic. This will be sent in to pharmacy on file. Please call if no improvement.

## 2017-06-12 NOTE — Addendum Note (Signed)
Addended by: Mar Daring on: 06/12/2017 11:29 AM   Modules accepted: Orders

## 2017-06-12 NOTE — Telephone Encounter (Signed)
Patient advised as below. Patient verbalizes understanding and is in agreement with treatment plan.  

## 2017-07-13 ENCOUNTER — Encounter: Payer: Self-pay | Admitting: Gastroenterology

## 2017-07-13 ENCOUNTER — Ambulatory Visit (INDEPENDENT_AMBULATORY_CARE_PROVIDER_SITE_OTHER): Payer: BLUE CROSS/BLUE SHIELD | Admitting: Gastroenterology

## 2017-07-13 ENCOUNTER — Telehealth: Payer: Self-pay

## 2017-07-13 ENCOUNTER — Other Ambulatory Visit: Payer: Self-pay

## 2017-07-13 VITALS — BP 119/75 | HR 71 | Temp 98.2°F | Ht 62.5 in | Wt 253.2 lb

## 2017-07-13 DIAGNOSIS — K529 Noninfective gastroenteritis and colitis, unspecified: Secondary | ICD-10-CM | POA: Diagnosis not present

## 2017-07-13 DIAGNOSIS — R1030 Lower abdominal pain, unspecified: Secondary | ICD-10-CM | POA: Diagnosis not present

## 2017-07-13 DIAGNOSIS — R197 Diarrhea, unspecified: Secondary | ICD-10-CM

## 2017-07-13 NOTE — Progress Notes (Signed)
Jonathon Bellows MD, MRCP(U.K) [address]  [place]  [place], [place] [address]  Main: [phone]  Fax: [phone]   Gastroenterology Consultation  Referring Provider:     Florian Buff* Primary Care Physician:  Mar Daring, PA-C Primary Gastroenterologist:  Dr. Jonathon Bellows  Reason for Consultation:     Diarrhea         HPI:   Teresa Pitts is a 56 y.o. y/o female referred for consultation & management  by . Mar Daring, PA-C.   Labs 04/2017- Hb 13.1,  She has been referred for abdominal pain and diarrhea.  CT abdomen 06/12/17- sigmoid colon diverticuloses , hepatic steatosis.   C diff, stool cultures, OP-negative.   Diarrhea :  Onset: ongoing since 04/2017 , since has been better, was put on a course of diverticulitis and treated with "one antibiotics" for 7 days , after which she felt better in terms of decrease in pain but the diarrhea didn't stopped.    Number of bowel movements a day : 4+ times a day - can range from 3-4 to 10 a day    Color : not sure   Consistency:  Watery  Present status: ongoing    Shape of stool:  When she had a solid bowel movement was long and narrow several weeks back    Weight loss:  No  Prior colonoscopy:  Never   Artificial sugars/sodas/chewing gum:  1 diet mountain dew a day , chews 2-4 sticks a day (conatins mannitol and xylitol), consumes splenda and sweet n low (4-6 sachets a day ) , crystalite 2 quarts of prepared juice.    Bloating:  Yes - when she passes gas it smells a lot   Gas:  Yes  Antibiotic use: yes  Does have abdominal bloating , and has abdominal pain when she is bloating all over her lower abdomen which is better after a bowel movement or after passing gas.    No issues with dairy . No family history of colon cancer or polyps. Not on any NSAID's at this time.   Past Medical History:  Diagnosis Date  . Allergy   . Depression     Past Surgical History:  Procedure Laterality Date  .  ABDOMINAL HYSTERECTOMY  2001  . TONSILLECTOMY AND ADENOIDECTOMY  1980    Prior to Admission medications   Medication Sig Start Date End Date Taking? Authorizing Provider  meloxicam (MOBIC) 15 MG tablet Take by mouth. 11/08/14  Yes [provider]  dicyclomine (BENTYL) 10 MG capsule Take 1 capsule (10 mg total) by mouth 4 (four) times daily -  before meals and at bedtime. 05/27/17   Mar Daring, PA-C  HYDROcodone-acetaminophen (NORCO/VICODIN) 5-325 MG tablet hydrocodone 5 mg-acetaminophen 325 mg tablet    [provider]  ibuprofen (ADVIL,MOTRIN) 200 MG tablet Take by mouth as needed.    [provider]  Loratadine (CLARITIN) 10 MG CAPS CLARITIN, 10MG (Oral Capsule)  1 Every Day, As Needed for 0 days  Quantity: 0.00;  Refills: 0   Ordered :04-Oct-2010  Edmonia James ;  Started 31-Aug-2008 Active Comments: Medication taken as needed. DX: 477.9 08/31/08   [provider]  ondansetron (ZOFRAN-ODT) 4 MG disintegrating tablet ondansetron 4 mg disintegrating tablet    [provider]  oxycodone (OXY-IR) 5 MG capsule oxycodone 5 mg tablet    [provider]  sulfamethoxazole-trimethoprim (BACTRIM DS,SEPTRA DS) 800-160 MG tablet Take 1 tablet by mouth 2 (two) times daily.  06/12/17   Mar Daring, PA-C  venlafaxine XR (EFFEXOR-XR) 75 MG 24 hr capsule Take 1 capsule (75 mg total) by mouth daily. 01/01/17   Mar Daring, PA-C    Family History  Problem Relation Age of Onset  . Cancer Mother        uterus  . Diabetes Father   . Hypertension Father      Social History  Substance Use Topics  . Smoking status: Never Smoker  . Smokeless tobacco: Never Used  . Alcohol use No    Allergies as of 07/13/2017  . (No Known Allergies)    Review of Systems:    All systems reviewed and negative except where noted in HPI.   Physical Exam:  There were no vitals taken for this visit. No LMP recorded. Patient has had a  hysterectomy. Psych:  Alert and cooperative. Normal mood and affect. General:   Alert,  Well-developed, well-nourished, pleasant and cooperative in NAD Head:  Normocephalic and atraumatic. Eyes:  Sclera clear, no icterus.   Conjunctiva pink. Ears:  Normal auditory acuity. Nose:  No deformity, discharge, or lesions. Mouth:  No deformity or lesions,oropharynx pink & moist. Neck:  Supple; no masses or thyromegaly. Lungs:  Respirations even and unlabored.  Clear throughout to auscultation.   No wheezes, crackles, or rhonchi. No acute distress. Heart:  Regular rate and rhythm; no murmurs, clicks, rubs, or gallops. Abdomen:  Normal bowel sounds.  No bruits.  Soft, non-tender and non-distended without masses, hepatosplenomegaly or hernias noted.  No guarding or rebound tenderness.    Pulses:  Normal pulses noted. Extremities:  No clubbing or edema.  No cyanosis. Neurologic:  Alert and oriented x3;  grossly normal neurologically. Marland Kitchen Psych:  Alert and cooperative. Normal mood and affect.  Imaging Studies: No results found.  Assessment and Plan:   Teresa Pitts is a 56 y.o. y/o female has been referred for diarrhea and abdominal pain. Her history suggests a consumption of a very large qty of artificial sugars in her diet which can all cause an osmotic diarrhea. Her abdominal pain is episodic and occurs when she is very bloated which in turn is due to the fermentation of these artificial sugars. She has never had a colonoscopy .   Plan  1. Stop all artificials sugars in diet and see how her dirrhea reacts over the next few weeks 2. Colonoscopy for change in bowel habits.  3. If no better after d/c artificial sugars then will screen for celiac disease and r/o SIBO  I have discussed alternative options, risks & benefits,  which include, but are not limited to, bleeding, infection, perforation,respiratory complication & drug reaction.  The patient agrees with this plan & written consent will be obtained.      Follow up in 3 months   Dr Jonathon Bellows MD,MRCP(U.K)

## 2017-07-13 NOTE — Telephone Encounter (Signed)
Gastroenterology Pre-Procedure Review  Request Date: 9/6 Requesting Physician: Dr. Vicente Males  PATIENT REVIEW QUESTIONS: The patient responded to the following health history questions as indicated:    1. Are you having any GI issues? yes (diarrhea) 2. Do you have a personal history of Polyps? no 3. Do you have a family history of Colon Cancer or Polyps? no 4. Diabetes Mellitus? no 5. Joint replacements in the past 12 months?no 6. Major health problems in the past 3 months?no 7. Any artificial heart valves, MVP, or defibrillator?no    MEDICATIONS & ALLERGIES:    Patient reports the following regarding taking any anticoagulation/antiplatelet therapy:   Plavix, Coumadin, Eliquis, Xarelto, Lovenox, Pradaxa, Brilinta, or Effient? no Aspirin? no  Patient confirms/reports the following medications:  Current Outpatient Prescriptions  Medication Sig Dispense Refill  . dicyclomine (BENTYL) 10 MG capsule Take 1 capsule (10 mg total) by mouth 4 (four) times daily -  before meals and at bedtime. (Patient not taking: Reported on 07/13/2017) 40 capsule 0  . HYDROcodone-acetaminophen (NORCO/VICODIN) 5-325 MG tablet hydrocodone 5 mg-acetaminophen 325 mg tablet    . ibuprofen (ADVIL,MOTRIN) 200 MG tablet Take by mouth as needed.    . Loratadine (CLARITIN) 10 MG CAPS CLARITIN, 10MG (Oral Capsule)  1 Every Day, As Needed for 0 days  Quantity: 0.00;  Refills: 0   Ordered :04-Oct-2010  Teresa Pitts ;  Started 31-Aug-2008 Active Comments: Medication taken as needed. DX: 477.9    . meloxicam (MOBIC) 15 MG tablet Take by mouth.    . ondansetron (ZOFRAN-ODT) 4 MG disintegrating tablet ondansetron 4 mg disintegrating tablet    . oxycodone (OXY-IR) 5 MG capsule oxycodone 5 mg tablet    . venlafaxine XR (EFFEXOR-XR) 75 MG 24 hr capsule Take 1 capsule (75 mg total) by mouth daily. 30 capsule 5   No current facility-administered medications for this visit.     Patient confirms/reports the following  allergies:  No Known Allergies  No orders of the defined types were placed in this encounter.   AUTHORIZATION INFORMATION Primary Insurance: 1D#: Group #:  Secondary Insurance: 1D#: Group #:  SCHEDULE INFORMATION: Date: 9/6 Time: Location: [facility]

## 2017-07-21 ENCOUNTER — Ambulatory Visit: Payer: BLUE CROSS/BLUE SHIELD | Admitting: Gastroenterology

## 2017-09-01 ENCOUNTER — Telehealth: Payer: Self-pay

## 2017-09-01 NOTE — Telephone Encounter (Signed)
Patient contacted office to rescheduled colonoscopy from 09/03/17 to 09/24/17 due to family emergency.  Elta Teresa Pitts has been contacted to move procedure date in Endo. Thanks [employer]

## 2017-09-22 ENCOUNTER — Other Ambulatory Visit: Payer: Self-pay | Admitting: Physician Assistant

## 2017-09-22 DIAGNOSIS — F4322 Adjustment disorder with anxiety: Secondary | ICD-10-CM

## 2017-09-24 ENCOUNTER — Ambulatory Visit: Payer: BLUE CROSS/BLUE SHIELD | Admitting: Anesthesiology

## 2017-09-24 ENCOUNTER — Ambulatory Visit
Admission: RE | Admit: 2017-09-24 | Discharge: 2017-09-24 | Disposition: A | Discharge status: Home / Self Care | Payer: BLUE CROSS/BLUE SHIELD | Source: Ambulatory Visit | Attending: Gastroenterology | Admitting: Gastroenterology

## 2017-09-24 ENCOUNTER — Encounter
Admission: RE | Disposition: A | Discharge status: Home / Self Care | Payer: Self-pay | Source: Ambulatory Visit | Attending: Gastroenterology

## 2017-09-24 DIAGNOSIS — Z79891 Long term (current) use of opiate analgesic: Secondary | ICD-10-CM | POA: Diagnosis not present

## 2017-09-24 DIAGNOSIS — F329 Major depressive disorder, single episode, unspecified: Secondary | ICD-10-CM | POA: Diagnosis not present

## 2017-09-24 DIAGNOSIS — K6389 Other specified diseases of intestine: Secondary | ICD-10-CM | POA: Diagnosis not present

## 2017-09-24 DIAGNOSIS — Z791 Long term (current) use of non-steroidal anti-inflammatories (NSAID): Secondary | ICD-10-CM | POA: Insufficient documentation

## 2017-09-24 DIAGNOSIS — K529 Noninfective gastroenteritis and colitis, unspecified: Secondary | ICD-10-CM | POA: Diagnosis not present

## 2017-09-24 DIAGNOSIS — Z79899 Other long term (current) drug therapy: Secondary | ICD-10-CM | POA: Insufficient documentation

## 2017-09-24 DIAGNOSIS — Z6841 Body Mass Index (BMI) 40.0 and over, adult: Secondary | ICD-10-CM | POA: Insufficient documentation

## 2017-09-24 DIAGNOSIS — R197 Diarrhea, unspecified: Secondary | ICD-10-CM

## 2017-09-24 HISTORY — PX: COLONOSCOPY WITH PROPOFOL: SHX5780

## 2017-09-24 SURGERY — COLONOSCOPY WITH PROPOFOL
Anesthesia: General

## 2017-09-24 MED ORDER — PROPOFOL 10 MG/ML IV BOLUS
INTRAVENOUS | Status: DC | PRN
  Administered 2017-09-24: 100 mg via INTRAVENOUS

## 2017-09-24 MED ORDER — PROPOFOL 500 MG/50ML IV EMUL
INTRAVENOUS | Status: DC | PRN
  Administered 2017-09-24: 160 ug/kg/min via INTRAVENOUS

## 2017-09-24 MED ORDER — LIDOCAINE 2% (20 MG/ML) 5 ML SYRINGE
INTRAMUSCULAR | Status: DC | PRN
  Administered 2017-09-24: 40 mg via INTRAVENOUS

## 2017-09-24 MED ORDER — PROPOFOL 500 MG/50ML IV EMUL
INTRAVENOUS | Status: AC
  Filled 2017-09-24: qty 50

## 2017-09-24 MED ORDER — FENTANYL CITRATE (PF) 100 MCG/2ML IJ SOLN
INTRAMUSCULAR | Status: AC
  Filled 2017-09-24: qty 2

## 2017-09-24 MED ORDER — SODIUM CHLORIDE 0.9 % IV SOLN
INTRAVENOUS | Status: DC
  Administered 2017-09-24 (×2): via INTRAVENOUS

## 2017-09-24 MED ORDER — FENTANYL CITRATE (PF) 100 MCG/2ML IJ SOLN
INTRAMUSCULAR | Status: DC | PRN
  Administered 2017-09-24 (×2): 50 ug via INTRAVENOUS

## 2017-09-24 NOTE — Transfer of Care (Signed)
Immediate Anesthesia Transfer of Care Note  Patient: Teresa Pitts  Procedure(s) Performed: Procedure(s): COLONOSCOPY WITH PROPOFOL (N/A)  Patient Location: PACU and Endoscopy Unit  Anesthesia Type:General  Level of Consciousness: drowsy  Airway & Oxygen Therapy: Patient Spontanous Breathing and Patient connected to nasal cannula oxygen  Post-op Assessment: Report given to RN and Post -op Vital signs reviewed and stable  Post vital signs: Reviewed and stable  Last Vitals:  Vitals:   09/24/17 1022  Pulse: 71  Resp: 18  Temp: 36.6 C  SpO2: 97%    Last Pain:  Vitals:   09/24/17 1022  TempSrc: Tympanic         Complications: No apparent anesthesia complications

## 2017-09-24 NOTE — Anesthesia Preprocedure Evaluation (Signed)
Anesthesia Evaluation  Patient identified by MRN, date of birth, ID band Patient awake    Reviewed: Allergy & Precautions, H&P , NPO status , Patient's Chart, lab work & pertinent test results, reviewed documented beta blocker date and time   Airway Mallampati: II   Neck ROM: full    Dental  (+) Poor Dentition   Pulmonary neg pulmonary ROS,    Pulmonary exam normal        Cardiovascular negative cardio ROS Normal cardiovascular exam Rhythm:regular Rate:Normal     Neuro/Psych PSYCHIATRIC DISORDERS negative neurological ROS  negative psych ROS   GI/Hepatic negative GI ROS, Neg liver ROS,   Endo/Other  negative endocrine ROSMorbid obesity  Renal/GU negative Renal ROS  negative genitourinary   Musculoskeletal   Abdominal   Peds  Hematology negative hematology ROS (+)   Anesthesia Other Findings Past Medical History: No date: Allergy No date: Depression Past Surgical History: 2001: ABDOMINAL HYSTERECTOMY 1980: TONSILLECTOMY AND ADENOIDECTOMY   Reproductive/Obstetrics negative OB ROS                             Anesthesia Physical Anesthesia Plan  ASA: III  Anesthesia Plan: General   Post-op Pain Management:    Induction:   PONV Risk Score and Plan:   Airway Management Planned:   Additional Equipment:   Intra-op Plan:   Post-operative Plan:   Informed Consent: I have reviewed the patients History and Physical, chart, labs and discussed the procedure including the risks, benefits and alternatives for the proposed anesthesia with the patient or authorized representative who has indicated his/her understanding and acceptance.   Dental Advisory Given  Plan Discussed with: CRNA  Anesthesia Plan Comments:         Anesthesia Quick Evaluation

## 2017-09-24 NOTE — H&P (Signed)
Jonathon Bellows MD [address], [place], [place] [address] Phone: [phone] Fax : [phone]  Primary Care Physician:  Mar Daring, PA-C Primary Gastroenterologist:  Dr. Jonathon Bellows   Pre-Procedure History & Physical: HPI:  Teresa Pitts is a 56 y.o. female is here for an colonoscopy.   Past Medical History:  Diagnosis Date  . Allergy   . Depression     Past Surgical History:  Procedure Laterality Date  . ABDOMINAL HYSTERECTOMY  2001  . TONSILLECTOMY AND ADENOIDECTOMY  1980    Prior to Admission medications   Medication Sig Start Date End Date Taking? Authorizing Provider  ibuprofen (ADVIL,MOTRIN) 200 MG tablet Take by mouth as needed.   Yes [provider]  venlafaxine XR (EFFEXOR-XR) 75 MG 24 hr capsule take 1 capsule by mouth once daily 09/22/17  Yes Burnette, Jennifer M, PA-C  dicyclomine (BENTYL) 10 MG capsule Take 1 capsule (10 mg total) by mouth 4 (four) times daily -  before meals and at bedtime. Patient not taking: Reported on 07/13/2017 05/27/17   Mar Daring, PA-C  HYDROcodone-acetaminophen (NORCO/VICODIN) 5-325 MG tablet hydrocodone 5 mg-acetaminophen 325 mg tablet    [provider]  Loratadine (CLARITIN) 10 MG CAPS CLARITIN, 10MG (Oral Capsule)  1 Every Day, As Needed for 0 days  Quantity: 0.00;  Refills: 0   Ordered :04-Oct-2010  Edmonia James ;  Started 31-Aug-2008 Active Comments: Medication taken as needed. DX: 477.9 08/31/08   [provider]  meloxicam (MOBIC) 15 MG tablet Take by mouth. 11/08/14   [provider]  ondansetron (ZOFRAN-ODT) 4 MG disintegrating tablet ondansetron 4 mg disintegrating tablet    [provider]  oxycodone (OXY-IR) 5 MG capsule oxycodone 5 mg tablet    [provider]    Allergies as of 07/13/2017  . (No Known Allergies)    Family History  Problem Relation Age of Onset  . Cancer Mother        uterus  . Diabetes Father   . Hypertension Father      Social History   Social History  . Marital status: Married    Spouse name: N/A  . Number of children: 2  . Years of education: N/A   Occupational History  . Not on file.   Social History Main Topics  . Smoking status: Never Smoker  . Smokeless tobacco: Never Used  . Alcohol use No  . Drug use: No  . Sexual activity: Yes    Birth control/ protection: Surgical   Other Topics Concern  . Not on file   Social History Narrative  . No narrative on file    Review of Systems: See HPI, otherwise negative ROS  Physical Exam: Pulse 71   Temp 97.9 F (36.6 C) (Tympanic)   Resp 18   Ht 5' 2"  (1.575 m)   Wt 238 lb (108 kg)   SpO2 97%   BMI 43.53 kg/m  General:   Alert,  pleasant and cooperative in NAD Head:  Normocephalic and atraumatic. Neck:  Supple; no masses or thyromegaly. Lungs:  Clear throughout to auscultation.    Heart:  Regular rate and rhythm. Abdomen:  Soft, nontender and nondistended. Normal bowel sounds, without guarding, and without rebound.   Neurologic:  Alert and  oriented x4;  grossly normal neurologically.  Impression/Plan: Teresa Pitts is here for an colonoscopy to be performed for diarrhea   Risks, benefits, limitations, and alternatives regarding  colonoscopy have been reviewed with the patient.  Questions have  been answered.  All parties agreeable.   Jonathon Bellows, MD  09/24/2017, 11:26 AM

## 2017-09-24 NOTE — Op Note (Signed)
[facility] Gastroenterology Patient Name: Teresa Pitts Procedure Date: 09/24/2017 12:06 PM MRN: [id number] Account #: [id number] Date of Birth: 01/23/1961 Admit Type: Outpatient Age: 56 Room: [facility] ENDO ROOM 4 Gender: Female Note Status: Finalized Procedure:            Colonoscopy Indications:          Clinically significant diarrhea of unexplained origin Providers:            Jonathon Bellows MD, MD Referring MD:         Mar Daring (Referring MD) Medicines:            Monitored Anesthesia Care Complications:        No immediate complications. Procedure:            Pre-Anesthesia Assessment:                       - Prior to the procedure, a History and Physical was                        performed, and patient medications, allergies and                        sensitivities were reviewed. The patient's tolerance of                        previous anesthesia was reviewed.                       - The risks and benefits of the procedure and the                        sedation options and risks were discussed with the                        patient. All questions were answered and informed                        consent was obtained.                       - ASA Grade Assessment: III - A patient with severe                        systemic disease.                       After obtaining informed consent, the colonoscope was                        passed under direct vision. Throughout the procedure,                        the patient's blood pressure, pulse, and oxygen                        saturations were monitored continuously. The                        Colonoscope was introduced through the anus and  advanced to the the cecum, identified by the                        appendiceal orifice, IC valve and transillumination.                        The colonoscopy was performed with ease. The patient                        tolerated the procedure  well. The quality of the bowel                        preparation was fair. Findings:      A diffuse area of moderately altered vascular, congested, erythematous,       inflamed and vascular-pattern-decreased mucosa was found in the entire       colon. Biopsies were taken with a cold forceps for histology. Right       colon was less severe compared to left colon Biopsies were taken with a       cold forceps for histology.      The terminal ileum appeared normal. Biopsies were taken with a cold       forceps for histology.      The exam was otherwise without abnormality. Impression:           - Preparation of the colon was fair.                       - Altered vascular, congested, erythematous, inflamed                        and vascular-pattern-decreased mucosa in the entire                        examined colon. Biopsied.                       - The examined portion of the ileum was normal.                        Biopsied.                       - The examination was otherwise normal. Recommendation:       - Discharge patient to home (with escort).                       - Resume previous diet.                       - Continue present medications.                       - Await pathology results.                       - Return to my office in 2 weeks.                       - Repeat colonoscopy when colitis is in remission. NO                        NSAID's  Procedure Code(s):    --- Professional ---                       [id number], Colonoscopy, flexible; with biopsy, single or                        multiple Diagnosis Code(s):    --- Professional ---                       K52.9, Noninfective gastroenteritis and colitis,                        unspecified                       K63.89, Other specified diseases of intestine                       R19.7, Diarrhea, unspecified CPT copyright 2016 American Medical Association. All rights reserved. The codes documented in this report are  preliminary and upon coder review may  be revised to meet current compliance requirements. Jonathon Bellows, MD Jonathon Bellows MD, MD 09/24/2017 12:32:08 PM This report has been signed electronically. Number of Addenda: 0 Note Initiated On: 09/24/2017 12:06 PM Scope Withdrawal Time: 0 hours 9 minutes 8 seconds  Total Procedure Duration: 0 hours 13 minutes 2 seconds       [facility]

## 2017-09-24 NOTE — Anesthesia Post-op Follow-up Note (Signed)
Anesthesia QCDR form completed.        

## 2017-09-25 ENCOUNTER — Encounter: Payer: Self-pay | Admitting: Gastroenterology

## 2017-09-25 LAB — SURGICAL PATHOLOGY

## 2017-09-28 NOTE — Anesthesia Postprocedure Evaluation (Signed)
Anesthesia Post Note  Patient: Teresa Pitts  Procedure(s) Performed: COLONOSCOPY WITH PROPOFOL (N/A )  Patient location during evaluation: PACU Anesthesia Type: General Level of consciousness: awake and alert Pain management: pain level controlled Vital Signs Assessment: post-procedure vital signs reviewed and stable Respiratory status: spontaneous breathing, nonlabored ventilation, respiratory function stable and patient connected to nasal cannula oxygen Cardiovascular status: blood pressure returned to baseline and stable Postop Assessment: no apparent nausea or vomiting Anesthetic complications: no     Last Vitals:  Vitals:   09/24/17 1233 09/24/17 1234  BP: 110/67 110/67  Pulse: (!) 59 65  Resp: 18 13  Temp: 36.5 C (!) 36.2 C  SpO2:  97%    Last Pain:  Vitals:   09/25/17 0742  TempSrc:   PainSc: 0-No pain                 Molli Barrows

## 2017-09-29 ENCOUNTER — Telehealth: Payer: Self-pay

## 2017-09-29 NOTE — Telephone Encounter (Signed)
-----   Message from Jonathon Bellows, MD sent at 09/27/2017  1:34 PM EDT ----- infrom bx shows colitis- ask if on nsaids if yes stop all of them if not then ask if having diarrhea or rectal bleeding or cramping

## 2017-09-29 NOTE — Telephone Encounter (Signed)
Unable to leave message. No VM setup.   Calling to advise patient of results per Dr. Vicente Males.   infrom bx shows colitis- ask if on nsaids if yes stop all of them if not then ask if having diarrhea or rectal bleeding or cramping

## 2017-10-19 ENCOUNTER — Encounter: Payer: Self-pay | Admitting: Gastroenterology

## 2017-10-19 ENCOUNTER — Ambulatory Visit: Payer: BLUE CROSS/BLUE SHIELD | Admitting: Gastroenterology

## 2017-10-19 NOTE — Progress Notes (Deleted)
She is here today to follow up for diarrhea.   Summary of history : She was initially seen and referred in 06/2017 for diarrhea since 04/2017. Improved after a course of antibiotics for diverticulitis. Number of bowel movements a day : 4+ times a day - can range from 3-4 to 10 a day   ,Consistency:  Watery .Artificial sugars/sodas/chewing gum:  1 diet mountain dew a day , chews 2-4 sticks a day (conatins mannitol and xylitol), consumes splenda and sweet n low (4-6 sachets a day ) , crystalite 2 quarts of prepared juice. Bloating:  Yes - when she passes gas it smells a lot  .Gas:  Yes .No family history of colon cancer or polyps. Not on any NSAID's    Interval history   7//2018-  10/19/2017   09/24/17- Colonoscopy -  Biopsies from the Rt , transverse ,sigmoid colon and rectum showed features of active colitis and chronicity features were also noted.   Stool tests were negative for infection in 04/2017   NSAID's: ****     Teresa Pitts is a 56 y.o. y/o female here to follow up for diarrhea and abdominal pain.She had been on a lot of artificial sugars, colonoscopy performed showed pan colitis.   Plan  1. Will repeat colonoscopy once colitis is in remission to screen for polyps.

## 2017-12-17 ENCOUNTER — Encounter (INDEPENDENT_AMBULATORY_CARE_PROVIDER_SITE_OTHER): Payer: Self-pay

## 2017-12-17 ENCOUNTER — Encounter: Payer: Self-pay | Admitting: Gastroenterology

## 2017-12-17 ENCOUNTER — Ambulatory Visit (INDEPENDENT_AMBULATORY_CARE_PROVIDER_SITE_OTHER): Payer: BLUE CROSS/BLUE SHIELD | Admitting: Gastroenterology

## 2017-12-17 VITALS — BP 124/70 | HR 90 | Ht 62.0 in | Wt 240.0 lb

## 2017-12-17 DIAGNOSIS — K5100 Ulcerative (chronic) pancolitis without complications: Secondary | ICD-10-CM | POA: Diagnosis not present

## 2017-12-17 MED ORDER — MESALAMINE 1.2 G PO TBEC: 4.8000 g | DELAYED_RELEASE_TABLET | Freq: Every day | ORAL | 0 refills | Status: DC

## 2017-12-17 MED ORDER — MESALAMINE 4 G RE ENEM: 4.0000 g | ENEMA | Freq: Every day | RECTAL | 0 refills | Status: DC

## 2017-12-17 NOTE — Progress Notes (Signed)
Jonathon Bellows MD, MRCP(U.K) [address]  [place]  [place], [place] [address]  Main: [phone]  Fax: [phone]   Primary Care Physician: Mar Daring, PA-C  Primary Gastroenterologist:  Dr. Jonathon Bellows   Chief Complaint  Patient presents with  . Follow-up diarrhea    HPI: Teresa Pitts is a 56 y.o. female   She is here today to follow up for diarrhea.    Summary of history : She was last seen back in 06/2017 for diarrhea. CT abdomen 06/12/17- sigmoid colon diverticuloses , hepatic steatosis.  C diff, stool cultures, OP-negative. The diarrhea was ongoing since 2016 , treated for diverticulitis. , 4 BM's a day , watery in consistency. Bloating+,abdominal pain ,foul smell. I felt her diarrhea was osmotic in nature due to large qty of artificial sugars in her diet .   Interval history   06/2017-11/2017  I performed a colonoscopy back in 08/2017 and I noted colitis throughout the colon , left side worse than right . Bx from terminal ileum were normal. Rt colon bx showed moderate active colitis with chronicity features in rectum, right colon , sigmoid colon  And mild activity in the transverse colon.  We tried getting in touch with the patient but could not do so and she is here today to follow up .    She says she did travel a lot and hence missed follow up . She says that she wasn't on any NSAID's at that time., Never smoked. Has cut down on artificial sugars. Presently has > 5bowel movements a day and occasionally has blood. Stool is watery . Abdominal cramping is occasional. No skin rash . She says her hair is brittle.     Current Outpatient Medications  Medication Sig Dispense Refill  . venlafaxine XR (EFFEXOR-XR) 75 MG 24 hr capsule take 1 capsule by mouth once daily 30 capsule 5   No current facility-administered medications for this visit.     Allergies as of 12/17/2017  . (No Known Allergies)    ROS:  General: Negative for anorexia, weight loss, fever,  chills, fatigue, weakness. ENT: Negative for hoarseness, difficulty swallowing , nasal congestion. CV: Negative for chest pain, angina, palpitations, dyspnea on exertion, peripheral edema.  Respiratory: Negative for dyspnea at rest, dyspnea on exertion, cough, sputum, wheezing.  GI: See history of present illness. GU:  Negative for dysuria, hematuria, urinary incontinence, urinary frequency, nocturnal urination.  Endo: Negative for unusual weight change.    Physical Examination:   BP 124/70   Pulse 90   Ht 5' 2"  (1.575 m)   Wt 240 lb (108.9 kg)   BMI 43.90 kg/m   General: Well-nourished, well-developed in no acute distress.  Eyes: No icterus. Conjunctivae pink. Mouth: Oropharyngeal mucosa moist and pink , no lesions erythema or exudate. Lungs: Clear to auscultation bilaterally. Non-labored. Heart: Regular rate and rhythm, no murmurs rubs or gallops.  Abdomen: Bowel sounds are normal, nontender, nondistended, no hepatosplenomegaly or masses, no abdominal bruits or hernia , no rebound or guarding.   Extremities: No lower extremity edema. No clubbing or deformities. Neuro: Alert and oriented x 3.  Grossly intact. Skin: Warm and dry, no jaundice.   Psych: Alert and cooperative, normal mood and affect.   Imaging Studies: No results found.  Assessment and Plan:   Teresa Pitts is a 56 y.o. y/o female for diarrhea. Colonoscopy in 08/2017 demonstrated moderate colitis with features of chronicity . At that time she says she was on NSAID's.Clinically her symptoms  are moderate and endoscopically are moderate as well. Explained new diagnosis.    Plan  1. No NSAID's 2. Patient information on ulcerative colitis 3. Commence on lialda 4.8 grams once a day  4. Add Rowasa enema at night  5. Check CBC,CMP,TSH,Celiac serology,CRP,Stool studies,calpropectin,vitamin D, Hep A/B serology 6. Will need vaccination for penumonia, flu, tetanus, zoster per PCP.  7. Annual skin exam .     Dr Jonathon Bellows   MD,MRCP [facility]) Follow up in 8 weeks

## 2017-12-17 NOTE — Patient Instructions (Signed)
During today's visit with Dr Vicente Males he has advised the following:  1. Read patient information on Ulcerative Colitis 2.Go to [facility] for labs 3. Pick up rxs for Lialda, and Rowalsa from your pharmacy. 4. Follow up with [place] in 8 weeks.  Happy holidays from [facility].

## 2018-02-17 ENCOUNTER — Ambulatory Visit: Payer: BLUE CROSS/BLUE SHIELD | Admitting: Gastroenterology

## 2018-03-09 ENCOUNTER — Ambulatory Visit: Payer: BLUE CROSS/BLUE SHIELD | Admitting: Gastroenterology

## 2018-03-12 ENCOUNTER — Other Ambulatory Visit: Payer: Self-pay

## 2018-03-12 ENCOUNTER — Telehealth: Payer: Self-pay

## 2018-03-12 DIAGNOSIS — K5100 Ulcerative (chronic) pancolitis without complications: Secondary | ICD-10-CM

## 2018-03-12 NOTE — Telephone Encounter (Signed)
Patient called stating that she lost her lab orders from last visit. Patient's husband was ill and she put her health aside until he recovered. Now she's ready to continue working on her health and would like to pick-up where she stopped.   Replaced the orders and printed. She's coming to pick them up today.

## 2018-03-22 ENCOUNTER — Other Ambulatory Visit: Payer: Self-pay | Admitting: Physician Assistant

## 2018-03-22 DIAGNOSIS — F4322 Adjustment disorder with anxiety: Secondary | ICD-10-CM

## 2018-03-22 NOTE — Telephone Encounter (Signed)
Please contact patient to schedule for CPE. Was due in January 2019.

## 2018-03-29 ENCOUNTER — Ambulatory Visit: Payer: BLUE CROSS/BLUE SHIELD | Admitting: Gastroenterology

## 2018-03-29 ENCOUNTER — Other Ambulatory Visit
Admission: RE | Admit: 2018-03-29 | Discharge: 2018-03-29 | Disposition: A | Discharge status: Home / Self Care | Payer: BLUE CROSS/BLUE SHIELD | Source: Ambulatory Visit | Attending: Gastroenterology | Admitting: Gastroenterology

## 2018-03-29 DIAGNOSIS — K5100 Ulcerative (chronic) pancolitis without complications: Secondary | ICD-10-CM | POA: Diagnosis not present

## 2018-03-29 LAB — CBC WITH DIFFERENTIAL/PLATELET
BASOS ABS: 0 10*3/uL (ref 0–0.1)
Basophils Relative: 0 %
EOS PCT: 1 %
Eosinophils Absolute: 0 10*3/uL (ref 0–0.7)
HCT: 39.5 % (ref 35.0–47.0)
Hemoglobin: 12.8 g/dL (ref 12.0–16.0)
LYMPHS ABS: 1.2 10*3/uL (ref 1.0–3.6)
Lymphocytes Relative: 26 %
MCH: 26.5 pg (ref 26.0–34.0)
MCHC: 32.4 g/dL (ref 32.0–36.0)
MCV: 81.9 fL (ref 80.0–100.0)
MONO ABS: 0.4 10*3/uL (ref 0.2–0.9)
Monocytes Relative: 8 %
Neutro Abs: 3 10*3/uL (ref 1.4–6.5)
Neutrophils Relative %: 65 %
PLATELETS: 228 10*3/uL (ref 150–440)
RBC: 4.83 MIL/uL (ref 3.80–5.20)
RDW: 14.7 % — AB (ref 11.5–14.5)
WBC: 4.6 10*3/uL (ref 3.6–11.0)

## 2018-03-29 LAB — C-REACTIVE PROTEIN: CRP: 1.1 mg/dL — ABNORMAL HIGH (ref <1.0)

## 2018-03-29 LAB — COMPREHENSIVE METABOLIC PANEL
ALT: 17 U/L (ref 14–54)
ANION GAP: 8 (ref 5–15)
AST: 19 U/L (ref 15–41)
Albumin: 3.6 g/dL (ref 3.5–5.0)
Alkaline Phosphatase: 50 U/L (ref 38–126)
BUN: 13 mg/dL (ref 6–20)
CHLORIDE: 106 mmol/L (ref 101–111)
CO2: 27 mmol/L (ref 22–32)
Calcium: 8.8 mg/dL — ABNORMAL LOW (ref 8.9–10.3)
Creatinine, Ser: 0.87 mg/dL (ref 0.44–1.00)
GFR calc Af Amer: >60 mL/min (ref >60)
Glucose, Bld: 99 mg/dL (ref 65–99)
POTASSIUM: 4.1 mmol/L (ref 3.5–5.1)
Sodium: 141 mmol/L (ref 135–145)
Total Bilirubin: 0.4 mg/dL (ref 0.3–1.2)
Total Protein: 7.1 g/dL (ref 6.5–8.1)

## 2018-03-29 LAB — TSH: TSH: 3.184 u[IU]/mL (ref 0.350–4.500)

## 2018-03-30 LAB — HEPATITIS A ANTIBODY, TOTAL: Hep A Total Ab: NEGATIVE

## 2018-03-30 LAB — HEPATITIS B SURFACE ANTIBODY, QUANTITATIVE: Hepatitis B-Post: <3.1 m[IU]/mL — ABNORMAL LOW (ref >9.9)

## 2018-03-30 LAB — VITAMIN D 25 HYDROXY (VIT D DEFICIENCY, FRACTURES): Vit D, 25-Hydroxy: 11.6 ng/mL — ABNORMAL LOW (ref 30.0–100.0)

## 2018-03-30 LAB — HEPATITIS C ANTIBODY

## 2018-03-31 ENCOUNTER — Other Ambulatory Visit
Admission: RE | Admit: 2018-03-31 | Discharge: 2018-03-31 | Disposition: A | Discharge status: Home / Self Care | Payer: BLUE CROSS/BLUE SHIELD | Source: Ambulatory Visit | Attending: Gastroenterology | Admitting: Gastroenterology

## 2018-03-31 DIAGNOSIS — K5100 Ulcerative (chronic) pancolitis without complications: Secondary | ICD-10-CM | POA: Insufficient documentation

## 2018-03-31 LAB — GASTROINTESTINAL PANEL BY PCR, STOOL (REPLACES STOOL CULTURE)
ASTROVIRUS: NOT DETECTED
Adenovirus F40/41: NOT DETECTED
Campylobacter species: NOT DETECTED
Cryptosporidium: NOT DETECTED
Cyclospora cayetanensis: NOT DETECTED
ENTEROAGGREGATIVE E COLI (EAEC): NOT DETECTED
ENTEROTOXIGENIC E COLI (ETEC): NOT DETECTED
Entamoeba histolytica: NOT DETECTED
Enteropathogenic E coli (EPEC): NOT DETECTED
Giardia lamblia: NOT DETECTED
NOROVIRUS GI/GII: NOT DETECTED
Plesimonas shigelloides: NOT DETECTED
ROTAVIRUS A: NOT DETECTED
SAPOVIRUS (I, II, IV, AND V): NOT DETECTED
SHIGA LIKE TOXIN PRODUCING E COLI (STEC): NOT DETECTED
Salmonella species: NOT DETECTED
Shigella/Enteroinvasive E coli (EIEC): NOT DETECTED
Vibrio cholerae: NOT DETECTED
Vibrio species: NOT DETECTED
Yersinia enterocolitica: NOT DETECTED

## 2018-03-31 LAB — CELIAC DISEASE PANEL
ENDOMYSIAL ANTIBODY IGA: NEGATIVE
IgA: 176 mg/dL (ref 87–352)
Tissue Transglutaminase Ab, IgA: 2 U/mL (ref 0–3)

## 2018-03-31 LAB — C DIFFICILE QUICK SCREEN W PCR REFLEX
C DIFFICILE (CDIFF) INTERP: NOT DETECTED
C DIFFICILE (CDIFF) TOXIN: NEGATIVE
C DIFFICLE (CDIFF) ANTIGEN: NEGATIVE

## 2018-04-01 ENCOUNTER — Encounter: Payer: Self-pay | Admitting: Gastroenterology

## 2018-04-01 LAB — QUANTIFERON-TB GOLD PLUS: QUANTIFERON-TB GOLD PLUS: NEGATIVE

## 2018-04-01 LAB — QUANTIFERON-TB GOLD PLUS (RQFGPL)
QuantiFERON Mitogen Value: >10 IU/mL
QuantiFERON Nil Value: 0.07 IU/mL
QuantiFERON TB1 Ag Value: 0.07 IU/mL
QuantiFERON TB2 Ag Value: 0.08 IU/mL

## 2018-04-02 LAB — CALPROTECTIN, FECAL: Calprotectin, Fecal: 485 ug/g — ABNORMAL HIGH (ref 0–120)

## 2018-04-05 ENCOUNTER — Other Ambulatory Visit: Payer: Self-pay

## 2018-04-05 DIAGNOSIS — E559 Vitamin D deficiency, unspecified: Secondary | ICD-10-CM

## 2018-04-05 MED ORDER — VITAMIN D (ERGOCALCIFEROL) 1.25 MG (50000 UNIT) PO CAPS: 50000.0000 [IU] | ORAL_CAPSULE | ORAL | 0 refills | Status: DC

## 2018-04-05 NOTE — Telephone Encounter (Signed)
Advised patient of results per Dr. Vicente Males.    - 1. Labs normal except needs Hep A/B vaccine  2. Low vitamin D suggest 50,000 units once every week for 8 weeks and then recheck   Patient states that she is continuing to have ulcerative colitis flare-ups.   Setup office visit.

## 2018-04-12 ENCOUNTER — Encounter: Payer: Self-pay | Admitting: Gastroenterology

## 2018-04-12 ENCOUNTER — Ambulatory Visit (INDEPENDENT_AMBULATORY_CARE_PROVIDER_SITE_OTHER): Payer: BLUE CROSS/BLUE SHIELD | Admitting: Gastroenterology

## 2018-04-12 DIAGNOSIS — K5100 Ulcerative (chronic) pancolitis without complications: Secondary | ICD-10-CM

## 2018-04-12 MED ORDER — MESALAMINE 4 G RE ENEM: 4.0000 g | ENEMA | Freq: Every day | RECTAL | 0 refills | Status: DC

## 2018-04-12 MED ORDER — MESALAMINE 1.2 G PO TBEC: 4.8000 g | DELAYED_RELEASE_TABLET | Freq: Every day | ORAL | 3 refills | Status: DC

## 2018-04-12 NOTE — Progress Notes (Signed)
Jonathon Bellows MD, MRCP(U.K) [address]  [place]  [place], [place] [address]  Main: [phone]  Fax: [phone]   Primary Care Physician: Mar Daring, PA-C  Primary Gastroenterologist:  Dr. Jonathon Bellows   No chief complaint on file.   HPI: Teresa Pitts is a 57 y.o. female  Summary of history : I see her for ulcerative colitis. H/o diarrhea since 2016.  I performed a colonoscopy back in 08/2017 and I noted colitis throughout the colon , left side worse than right . Bx from terminal ileum were normal. Rt colon bx showed moderate active colitis with chronicity features in rectum, right colon , sigmoid colon  And mild activity in the transverse colon She says that she wasn't on any NSAID's at that time., Never smoked.   Interval history  11/2017-04/12/18    Labs 03/31/18- Stool PCR, C diff -negative. Fecal Calpropectin elevated at 485 .Vitamin D low , Needs Hep A/B vaccine . Hb 12.8,CRP 1.1 . Celiac serology negative.    Since her last visit her husband had a CABG. She says the Lialda was taken for a month . When she took the medication said she still had diarrhea but helped. Has 5 bowel movements in the morning . Upto 15 bowel movements a day . Blood in her stool. Noted.   Did not do the enemas.   Current Outpatient Medications  Medication Sig Dispense Refill  . mesalamine (LIALDA) 1.2 g EC tablet Take 4 tablets (4.8 g total) by mouth daily with breakfast. 240 tablet 0  . mesalamine (ROWASA) 4 g enema Place 60 mLs (4 g total) rectally at bedtime. 1860 mL 0  . venlafaxine XR (EFFEXOR-XR) 75 MG 24 hr capsule TAKE ONE CAPSULE BY MOUTH ONCE DAILY 30 capsule 0  . Vitamin D, Ergocalciferol, (DRISDOL) 50000 units CAPS capsule Take 1 capsule (50,000 Units total) by mouth every 7 (seven) days for 8 doses. 8 capsule 0   No current facility-administered medications for this visit.     Allergies as of 04/12/2018  . (No Known Allergies)    ROS:  General: Negative for  anorexia, weight loss, fever, chills, fatigue, weakness. ENT: Negative for hoarseness, difficulty swallowing , nasal congestion. CV: Negative for chest pain, angina, palpitations, dyspnea on exertion, peripheral edema.  Respiratory: Negative for dyspnea at rest, dyspnea on exertion, cough, sputum, wheezing.  GI: See history of present illness. GU:  Negative for dysuria, hematuria, urinary incontinence, urinary frequency, nocturnal urination.  Endo: Negative for unusual weight change.    Physical Examination:   There were no vitals taken for this visit.  General: Well-nourished, well-developed in no acute distress.  Eyes: No icterus. Conjunctivae pink. Mouth: Oropharyngeal mucosa moist and pink , no lesions erythema or exudate. Lungs: Clear to auscultation bilaterally. Non-labored. Heart: Regular rate and rhythm, no murmurs rubs or gallops.  Abdomen: Bowel sounds are normal, nontender, nondistended, no hepatosplenomegaly or masses, no abdominal bruits or hernia , no rebound or guarding.   Extremities: No lower extremity edema. No clubbing or deformities. Neuro: Alert and oriented x 3.  Grossly intact. Skin: Warm and dry, no jaundice.   Psych: Alert and cooperative, normal mood and affect.   Imaging Studies: No results found.  Assessment and Plan:   Teresa Pitts is a 57 y.o. y/o female here to follow up for ulcerative pan coliits diagnosed in 2018 . After initial diagnosis she didn't take her meds for more than 4 weeks and didn't call or follow up despite having  atleast moderate symptoms. Explained the importance of taking her meds, She was preoccupied with her husbands health .    Plan  1. No NSAID's 2. Hep A/ B vaccine with Mar Daring, PA-C  vaccination for penumonia, flu, tetanus, zoster per PCP.  3. Check Vitamin D levels in 3 months  3. Commence on lialda 4.8 grams once a day  4. Add Rowasa enema at night  5. Annual skin exam .    Dr Jonathon Bellows  MD,MRCP  [facility]) Follow up in 6 weeks

## 2018-04-13 ENCOUNTER — Other Ambulatory Visit: Payer: Self-pay

## 2018-04-13 DIAGNOSIS — E559 Vitamin D deficiency, unspecified: Secondary | ICD-10-CM

## 2018-04-13 MED ORDER — VITAMIN D (ERGOCALCIFEROL) 1.25 MG (50000 UNIT) PO CAPS: ORAL_CAPSULE | ORAL | 0 refills | Status: DC

## 2018-04-20 ENCOUNTER — Telehealth: Payer: Self-pay | Admitting: Gastroenterology

## 2018-04-20 ENCOUNTER — Telehealth: Payer: Self-pay | Admitting: Physician Assistant

## 2018-04-20 DIAGNOSIS — F4322 Adjustment disorder with anxiety: Secondary | ICD-10-CM

## 2018-04-20 NOTE — Telephone Encounter (Signed)
Pt contacted office for refill request on the following medications:  venlafaxine XR (EFFEXOR-XR) 75 MG 24 hr capsule   [facility]  Pt stated she wasn't advised that she needed an OV when Rx was sent in March. Pt is scheduled for OV on 04/26/18. 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 Pt is requesting Rx to last until here OV. Please advise. Thanks TNP

## 2018-04-20 NOTE — Telephone Encounter (Signed)
Pt left vm she states her new rx is totalling $1100 and wanted to see if there was something cheaper that would work for her please call pt

## 2018-04-21 MED ORDER — VENLAFAXINE HCL ER 75 MG PO CP24: 75.0000 mg | ORAL_CAPSULE | Freq: Every day | ORAL | 0 refills | Status: DC

## 2018-04-21 NOTE — Telephone Encounter (Signed)
Refilled

## 2018-04-26 ENCOUNTER — Ambulatory Visit: Payer: BLUE CROSS/BLUE SHIELD | Admitting: Physician Assistant

## 2018-04-26 NOTE — Progress Notes (Deleted)
       Patient: Teresa Pitts Female    DOB: Jun 08, 1961   57 y.o.   MRN: [id number] Visit Date: 04/26/2018  Today's Provider: Mar Daring, PA-C   No chief complaint on file.  Subjective:    HPI     No Known Allergies   Current Outpatient Medications:  .  mesalamine (LIALDA) 1.2 g EC tablet, Take 4 tablets (4.8 g total) by mouth daily with breakfast. (Patient not taking: Reported on 04/12/2018), Disp: 240 tablet, Rfl: 3 .  mesalamine (ROWASA) 4 g enema, Place 60 mLs (4 g total) rectally at bedtime., Disp: 1860 mL, Rfl: 0 .  mesalamine (ROWASA) 4 g enema, Place 60 mLs (4 g total) rectally at bedtime. (Patient not taking: Reported on 04/12/2018), Disp: 1800 mL, Rfl: 0 .  venlafaxine XR (EFFEXOR-XR) 75 MG 24 hr capsule, Take 1 capsule (75 mg total) by mouth daily., Disp: 30 capsule, Rfl: 0 .  Vitamin D, Ergocalciferol, (DRISDOL) 50000 units CAPS capsule, Take 1 capsule weekly (every 7 days) for 8 weeks., Disp: 8 capsule, Rfl: 0  Review of Systems  Social History   Tobacco Use  . Smoking status: Never Smoker  . Smokeless tobacco: Never Used  Substance Use Topics  . Alcohol use: No   Objective:   There were no vitals taken for this visit. There were no vitals filed for this visit.   Physical Exam      Assessment & Plan:           Mar Daring, PA-C  [facility]

## 2018-04-27 ENCOUNTER — Other Ambulatory Visit: Payer: Self-pay

## 2018-04-27 NOTE — Telephone Encounter (Signed)
Contacted [employer] and [facility] concerning a mesalamine Rx for the patient.   Lialda = $1100 Apriso = 75  Advised Teresa Pitts of the costs. Total billing is being paid by the patient due to deductibles.  Teresa Pitts is aware and will contact us concerning ordering Apriso.  A message was sent to Dr. Vicente Males advising the same.

## 2018-05-03 ENCOUNTER — Encounter: Payer: Self-pay | Admitting: Physician Assistant

## 2018-05-03 ENCOUNTER — Ambulatory Visit (INDEPENDENT_AMBULATORY_CARE_PROVIDER_SITE_OTHER): Payer: BLUE CROSS/BLUE SHIELD | Admitting: Physician Assistant

## 2018-05-03 VITALS — BP 120/80 | HR 64 | Temp 98.4°F | Resp 16 | Ht 62.0 in | Wt 243.8 lb

## 2018-05-03 DIAGNOSIS — Z1231 Encounter for screening mammogram for malignant neoplasm of breast: Secondary | ICD-10-CM

## 2018-05-03 DIAGNOSIS — Z1239 Encounter for other screening for malignant neoplasm of breast: Secondary | ICD-10-CM

## 2018-05-03 DIAGNOSIS — E7800 Pure hypercholesterolemia, unspecified: Secondary | ICD-10-CM | POA: Diagnosis not present

## 2018-05-03 DIAGNOSIS — Z23 Encounter for immunization: Secondary | ICD-10-CM

## 2018-05-03 DIAGNOSIS — F4322 Adjustment disorder with anxiety: Secondary | ICD-10-CM

## 2018-05-03 MED ORDER — VENLAFAXINE HCL ER 75 MG PO CP24: 75.0000 mg | ORAL_CAPSULE | Freq: Every day | ORAL | 3 refills | Status: DC

## 2018-05-03 NOTE — Progress Notes (Signed)
Patient: Teresa Pitts Female    DOB: [birth year]   57 y.o.   MRN: [id number] Visit Date: 05/03/2018  Today's Provider: Mar Daring, PA-C   Chief Complaint  Patient presents with  . Depression   Subjective:    HPI  Depression, Follow-up  She  was last seen for this 1 years ago. Changes made at last visit include no changes.   She reports excellent compliance with treatment. She is not having side effects.   She reports excellent tolerance of treatment. Current symptoms include: patient denies any symptoms She feels she is Improved since last visit. ------------------------------------------------------------------------    No Known Allergies   Current Outpatient Medications:  .  venlafaxine XR (EFFEXOR-XR) 75 MG 24 hr capsule, Take 1 capsule (75 mg total) by mouth daily., Disp: 30 capsule, Rfl: 0 .  Vitamin D, Ergocalciferol, (DRISDOL) 50000 units CAPS capsule, Take 1 capsule weekly (every 7 days) for 8 weeks., Disp: 8 capsule, Rfl: 0  Review of Systems  Constitutional: Negative.   HENT: Negative.   Respiratory: Negative.   Cardiovascular: Negative.   Psychiatric/Behavioral: Negative.     Social History   Tobacco Use  . Smoking status: Never Smoker  . Smokeless tobacco: Never Used  Substance Use Topics  . Alcohol use: No   Objective:   BP 120/80 (BP Location: Left Arm, Patient Position: Sitting, Cuff Size: Large)   Pulse 64   Temp 98.4 F (36.9 C) (Oral)   Resp 16   Ht 5' 2"  (1.575 m)   Wt 243 lb 12.8 oz (110.6 kg)   SpO2 97%   BMI 44.59 kg/m  Vitals:   05/03/18 0829  BP: 120/80  Pulse: 64  Resp: 16  Temp: 98.4 F (36.9 C)  TempSrc: Oral  SpO2: 97%  Weight: 243 lb 12.8 oz (110.6 kg)  Height: 5' 2"  (1.575 m)     Physical Exam  Constitutional: She appears well-developed and well-nourished. No distress.  Neck: Normal range of motion. Neck supple. No JVD present. No tracheal deviation present. No thyromegaly present.    Cardiovascular: Normal rate, regular rhythm and normal heart sounds. Exam reveals no gallop and no friction rub.  No murmur heard. Pulmonary/Chest: Effort normal and breath sounds normal. No respiratory distress. She has no wheezes. She has no rales.  Musculoskeletal: She exhibits no edema.  Lymphadenopathy:    She has no cervical adenopathy.  Skin: She is not diaphoretic.  Vitals reviewed.      Assessment & Plan:     1. Adjustment disorder with anxious mood Stable. Diagnosis pulled for medication refill. Continue current medical treatment plan. - venlafaxine XR (EFFEXOR-XR) 75 MG 24 hr capsule; Take 1 capsule (75 mg total) by mouth daily.  Dispense: 90 capsule; Refill: 3  2. Breast cancer screening There is no family history of breast cancer. She does perform regular self breast exams. Mammogram was ordered as below. Information for [facility] clinic was given to patient so she may schedule her mammogram at her convenience. - MM DIGITAL SCREENING BILATERAL; Future  3. Pure hypercholesterolemia Will check labs as below and f/u pending results. - Lipid Profile  4. Need for Td vaccine Td  Booster Vaccine given to patient without complications. Patient sat for 15 minutes after administration and was tolerated well without adverse effects. - Td vaccine greater than or equal to 7yo preservative free IM       Mar Daring, PA-C  [facility]

## 2018-05-04 LAB — LIPID PANEL
CHOLESTEROL TOTAL: 229 mg/dL — AB (ref 100–199)
Chol/HDL Ratio: 3.4 ratio (ref 0.0–4.4)
HDL: 67 mg/dL (ref >39)
LDL Calculated: 144 mg/dL — ABNORMAL HIGH (ref 0–99)
Triglycerides: 91 mg/dL (ref 0–149)
VLDL Cholesterol Cal: 18 mg/dL (ref 5–40)

## 2018-06-01 ENCOUNTER — Ambulatory Visit: Payer: BLUE CROSS/BLUE SHIELD | Admitting: Gastroenterology

## 2018-06-14 ENCOUNTER — Encounter: Payer: Self-pay | Admitting: Physician Assistant

## 2018-06-14 NOTE — Progress Notes (Deleted)
Patient: Teresa Pitts, Female    DOB: 09-20-1961, 57 y.o.   MRN: [id number] Visit Date: 06/14/2018  Today's Provider: Mar Daring, PA-C   No chief complaint on file.  Subjective:    Annual physical exam Teresa Pitts is a 57 y.o. female who presents today for health maintenance and complete physical. She feels {DESC; WELL/FAIRLY WELL/POORLY:18703}. She reports exercising ***. She reports she is sleeping {DESC; WELL/FAIRLY WELL/POORLY:18703}.   Last CPE:01/01/17 Pap:09/25/14 Negative Colonoscopy:09/24/17 -----------------------------------------------------------------   Review of Systems  Constitutional: Negative.   HENT: Negative.   Eyes: Negative.   Respiratory: Negative.   Cardiovascular: Negative.   Gastrointestinal: Negative.   Endocrine: Negative.   Genitourinary: Negative.   Musculoskeletal: Negative.   Skin: Negative.   Allergic/Immunologic: Negative.   Neurological: Negative.   Hematological: Negative.   Psychiatric/Behavioral: Negative.     Social History      She  reports that she has never smoked. She has never used smokeless tobacco. She reports that she does not drink alcohol or use drugs.       Social History   Socioeconomic History  . Marital status: Married    Spouse name: Not on file  . Number of children: 2  . Years of education: Not on file  . Highest education level: Not on file  Occupational History  . Not on file  Social Needs  . Financial resource strain: Not on file  . Food insecurity:    Worry: Not on file    Inability: Not on file  . Transportation needs:    Medical: Not on file    Non-medical: Not on file  Tobacco Use  . Smoking status: Never Smoker  . Smokeless tobacco: Never Used  Substance and Sexual Activity  . Alcohol use: No  . Drug use: No  . Sexual activity: Yes    Birth control/protection: Surgical  Lifestyle  . Physical activity:    Days per week: Not on file    Minutes per session: Not on file  .  Stress: Not on file  Relationships  . Social connections:    Talks on phone: Not on file    Gets together: Not on file    Attends religious service: Not on file    Active member of club or organization: Not on file    Attends meetings of clubs or organizations: Not on file    Relationship status: Not on file  Other Topics Concern  . Not on file  Social History Narrative  . Not on file    Past Medical History:  Diagnosis Date  . Allergy   . Depression      Patient Active Problem List   Diagnosis Date Noted  . Hypercholesterolemia 01/02/2017  . Pre-diabetes 01/02/2017  . Urge incontinence of urine 01/01/2017  . Digital mucous cyst 01/01/2017  . Left lateral epicondylitis 01/01/2017  . Primary osteoarthritis of both knees 11/26/2015  . Body mass index of 60 or higher 09/26/2015  . Burning or prickling sensation 09/26/2015  . Herpes zona 09/26/2015  . Adjustment reaction 06/18/2015  . Allergic rhinitis 05/26/2009  . Extreme obesity 09/07/2008  . Abnormal maternal glucose tolerance, with delivery 12/29/1988    Past Surgical History:  Procedure Laterality Date  . ABDOMINAL HYSTERECTOMY  2001  . COLONOSCOPY WITH PROPOFOL N/A 09/24/2017   Procedure: COLONOSCOPY WITH PROPOFOL;  Surgeon: Jonathon Bellows, MD;  Location: [facility] ENDOSCOPY;  Service: Gastroenterology;  Laterality: N/A;  . [place]  Family History        Family Status  Relation Name Status  . Mother  Alive  . Father  Alive  . Brother  Deceased       suicide  . Daughter  Alive  . Son  Alive        Her family history includes Cancer in her mother; Diabetes in her father; Hypertension in her father.      No Known Allergies   Current Outpatient Medications:  .  venlafaxine XR (EFFEXOR-XR) 75 MG 24 hr capsule, Take 1 capsule (75 mg total) by mouth daily., Disp: 90 capsule, Rfl: 3 .  Vitamin D, Ergocalciferol, (DRISDOL) 50000 units CAPS capsule, Take 1 capsule weekly (every 7 days)  for 8 weeks., Disp: 8 capsule, Rfl: 0   Patient Care Team: Mar Daring, PA-C as PCP - General (Family Medicine)      Objective:   Vitals: There were no vitals taken for this visit.  Physical Exam   Depression Screen PHQ 2/9 Scores 05/03/2018 01/01/2017 09/26/2015  PHQ - 2 Score 0 0 0  PHQ- 9 Score 0 - -      Assessment & Plan:     Routine Health Maintenance and Physical Exam  Exercise Activities and Dietary recommendations Goals    . Exercise 150 minutes per week (moderate activity)       Immunization History  Administered Date(s) Administered  . Influenza Split 12/12/2010  . Influenza,inj,Quad PF,6+ Mos 09/25/2014, 09/26/2015  . Td 05/03/2018  . Tdap 09/03/2007    Health Maintenance  Topic Date Due  . MAMMOGRAM  10/31/2012  . PAP SMEAR  09/25/2017  . INFLUENZA VACCINE  07/29/2018  . COLONOSCOPY  09/25/2027  . TETANUS/TDAP  05/03/2028  . Hepatitis C Screening  Completed  . HIV Screening  Completed     Discussed health benefits of physical activity, and encouraged her to engage in regular exercise appropriate for her age and condition.    --------------------------------------------------------------------    Mar Daring, PA-C  [place]

## 2019-02-03 DIAGNOSIS — Z6841 Body Mass Index (BMI) 40.0 and over, adult: Secondary | ICD-10-CM | POA: Insufficient documentation

## 2019-04-25 ENCOUNTER — Other Ambulatory Visit: Payer: Self-pay | Admitting: Physician Assistant

## 2019-04-25 ENCOUNTER — Telehealth: Payer: Self-pay | Admitting: Physician Assistant

## 2019-04-25 DIAGNOSIS — F4322 Adjustment disorder with anxiety: Secondary | ICD-10-CM

## 2019-04-25 NOTE — Telephone Encounter (Signed)
L.O.V. 05/03/2018, please advise.

## 2019-04-25 NOTE — Telephone Encounter (Signed)
Already refilled

## 2019-04-25 NOTE — Telephone Encounter (Signed)
Pt needing a refill by 2 pm today on: venlafaxine XR (EFFEXOR-XR) 75 MG 24 hr capsule - she will start vomiting if not taken in time.  Please fill at:  [place], [place] - [place] [phone] (Phone) [phone] (Fax)   Thanks, [facility]

## 2019-06-03 IMAGING — CR DG ABDOMEN 2V
1 series · 3 of 3 positions shown · non-contrast
Comparison: None.

CLINICAL DATA: Diarrhea.  Abdominal tenderness.  Back pain .

EXAM:
ABDOMEN - 2 VIEW

[Series 1: dg abd 2 views · 0.14mm/px · 3 of 3 slices shown]
[im 1/3]
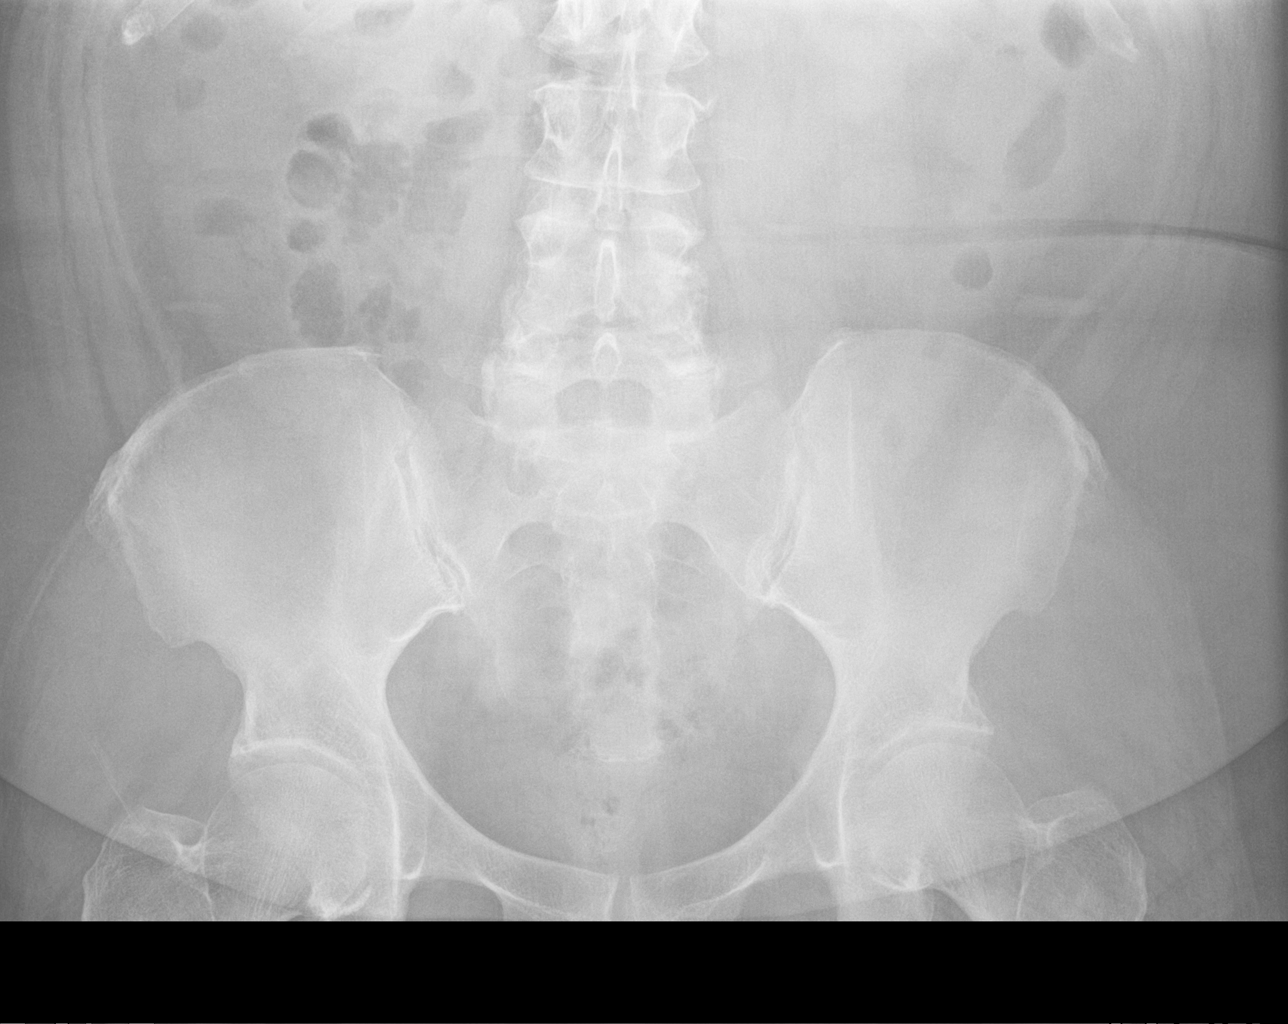
[im 2/3]
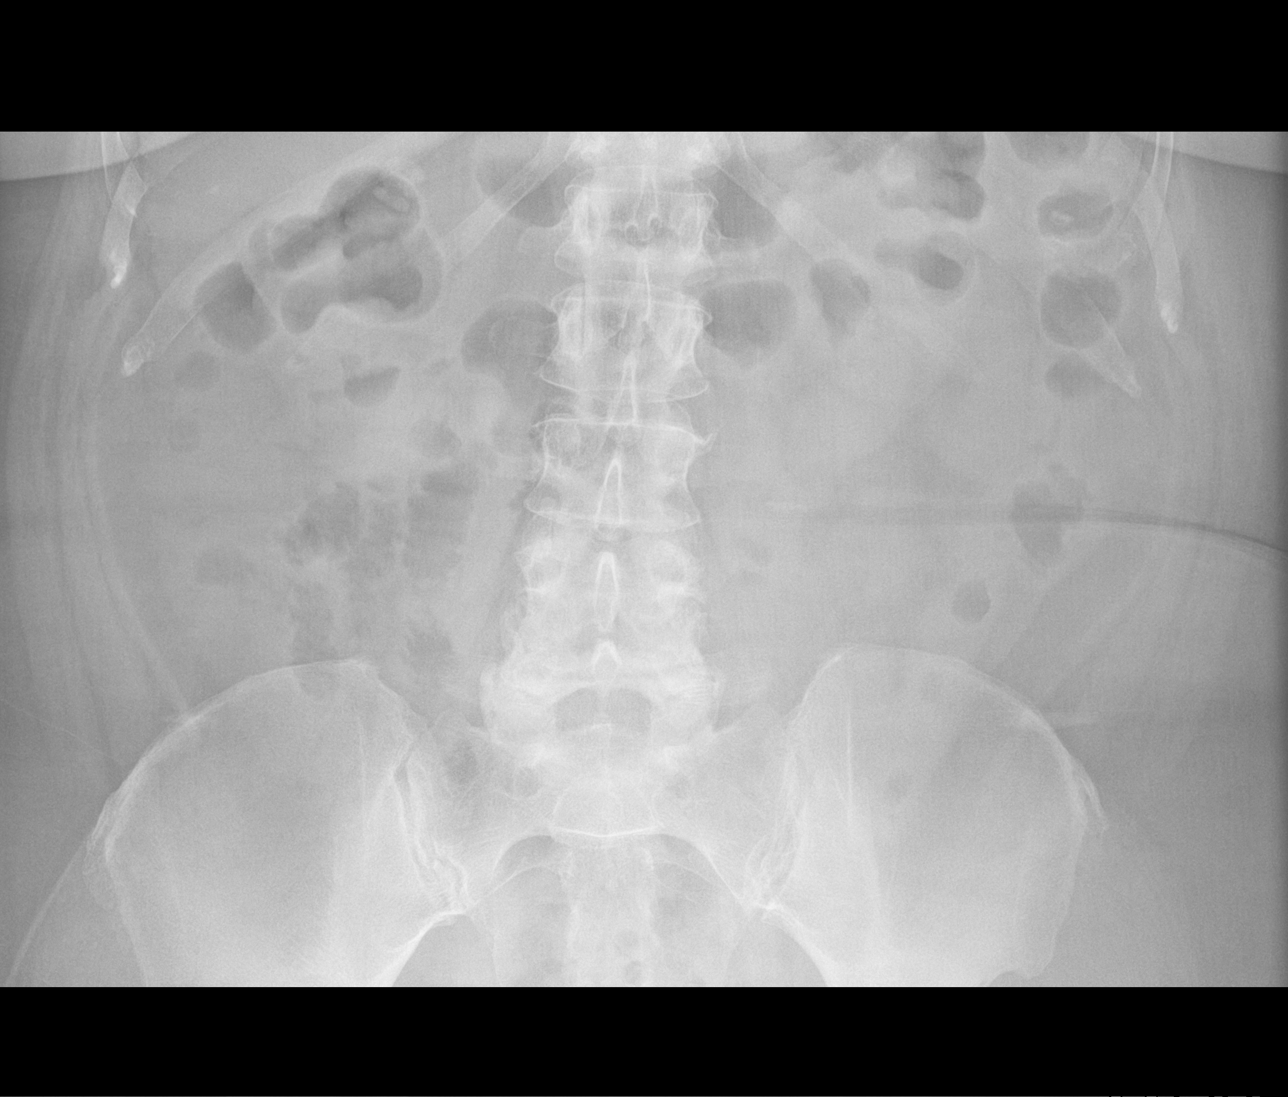
[im 3/3]
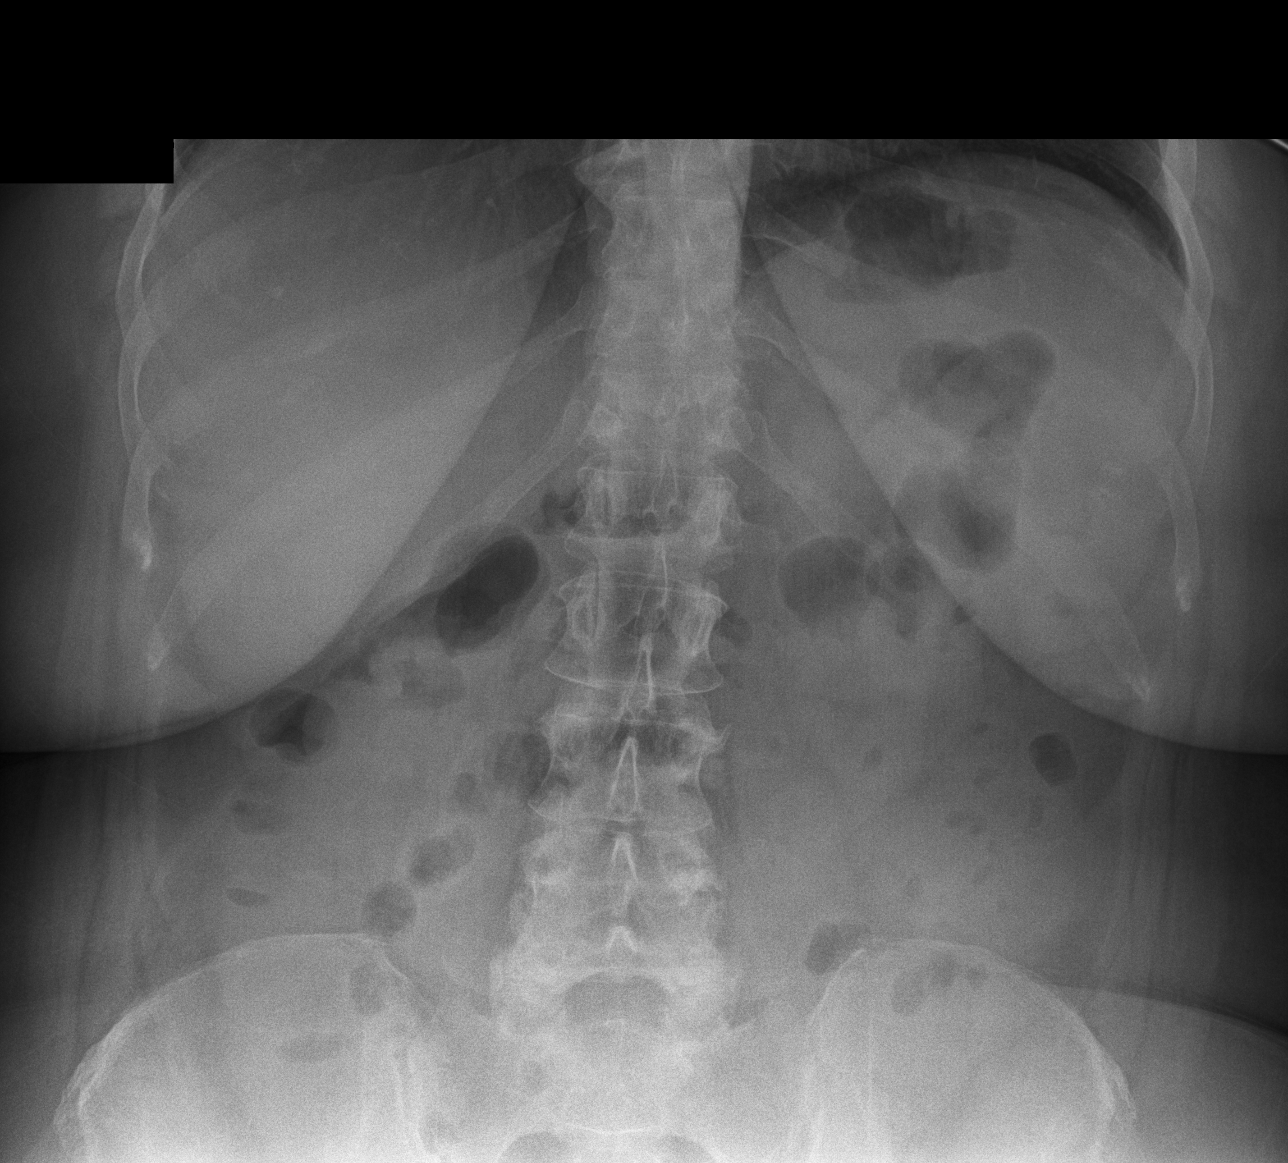

[3 of 3 positions shown; findings below may reference images not displayed]

FINDINGS: Soft tissue structures are unremarkable. Several nonspecific
nondilated air-filled loops small bowel. Colonic gas pattern is
normal. No bowel distention. No free air. No pathologic intra-
abdominal calcifications. Degenerative changes lumbar spine.
IMPRESSION: No acute abnormality identified.

## 2019-06-10 IMAGING — CT CT ABD-PELV W/O CM
1 of 2 series · 15 of 32 positions shown, 19 images · non-contrast
Comparison: Abdominal radiographs 06/05/2017

CLINICAL DATA: Nausea, diarrhea, and left lower quadrant pain for
4-6 weeks.

EXAM:
CT ABDOMEN AND PELVIS WITHOUT CONTRAST
TECHNIQUE: Multidetector CT imaging of the abdomen and pelvis was performed
following the standard protocol without IV contrast.

[Series 2: axial st · axial · 0.78mm/px · z∈[-1014,-569]mm · 15 of 99 slices shown, 19 images]
[im 5/99  soft-tissue]
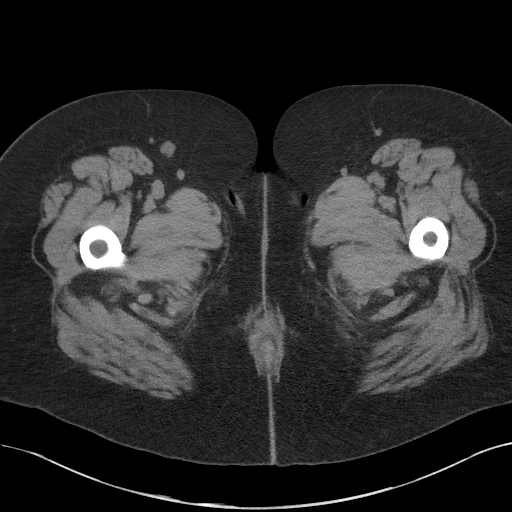
[im 5/99  bone]
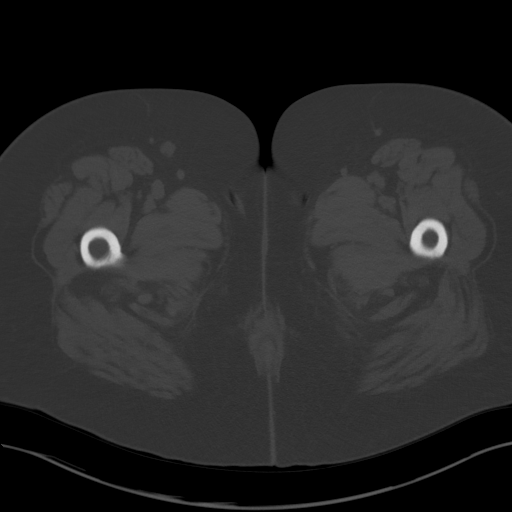
[im 13/99  soft-tissue]
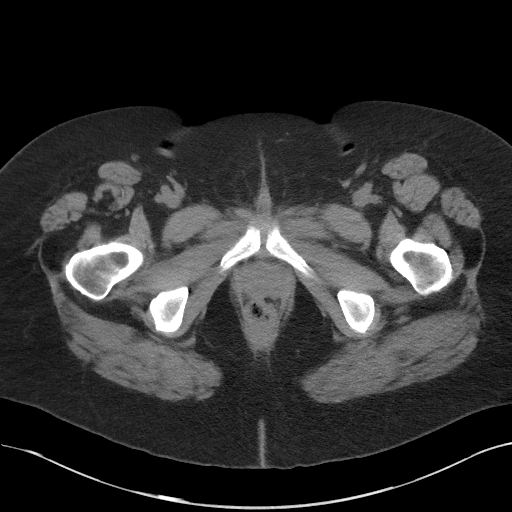
[im 22/99  soft-tissue]
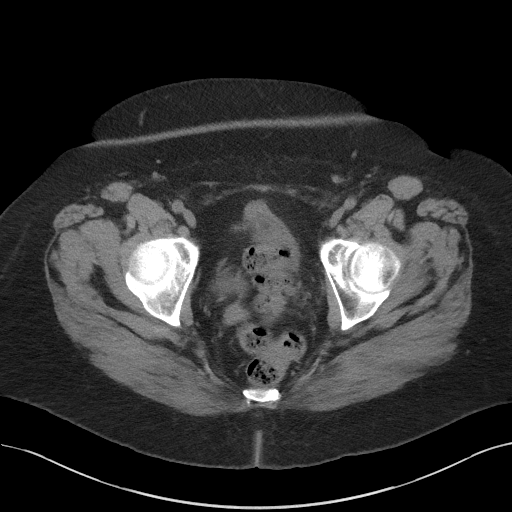
[im 26/99  soft-tissue]
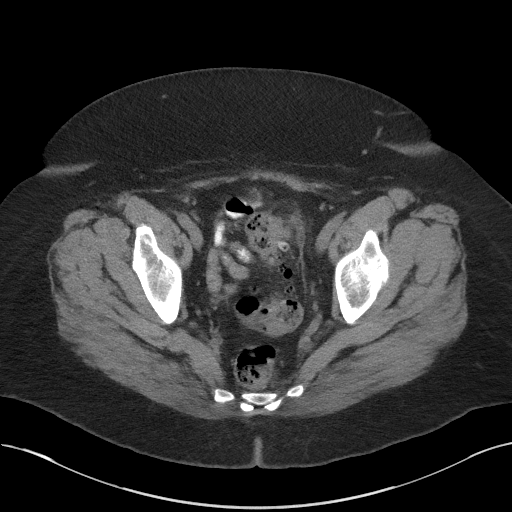
[im 35/99  soft-tissue]
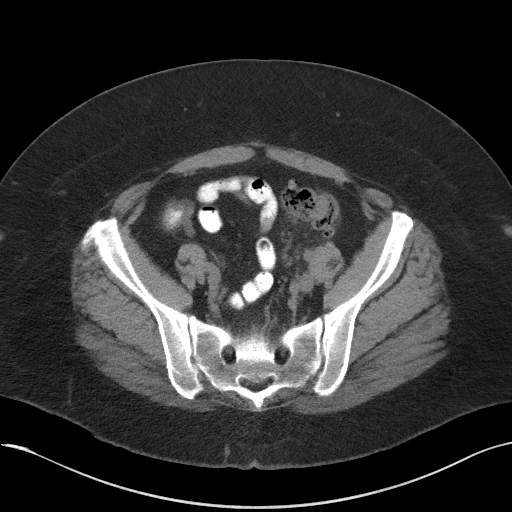
[im 43/99  soft-tissue]
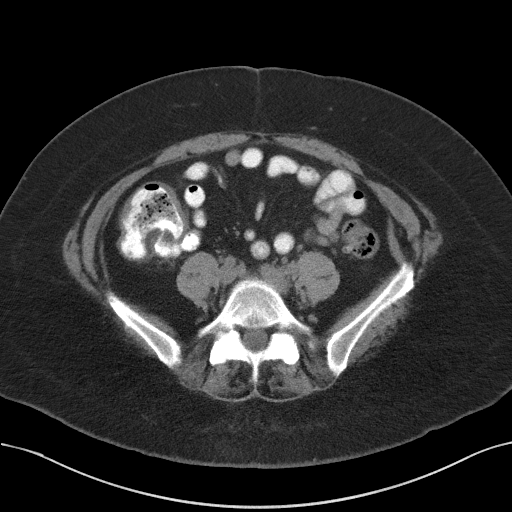
[im 52/99  soft-tissue]
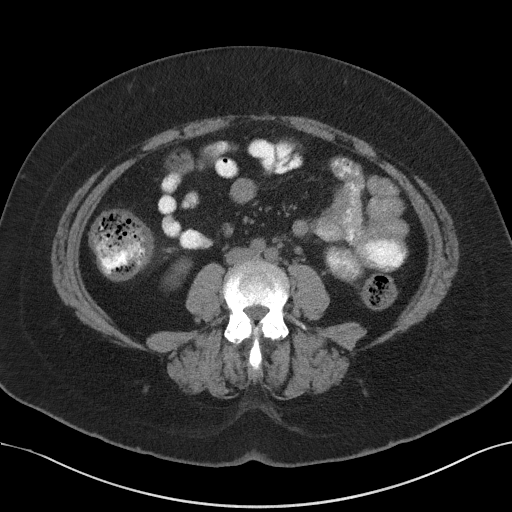
[im 56/99  soft-tissue]
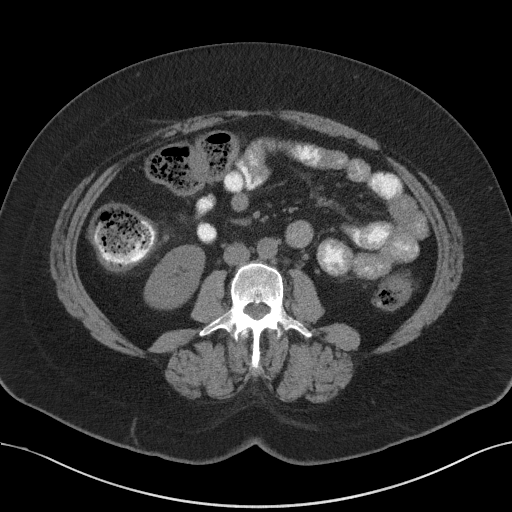
[im 64/99  soft-tissue]
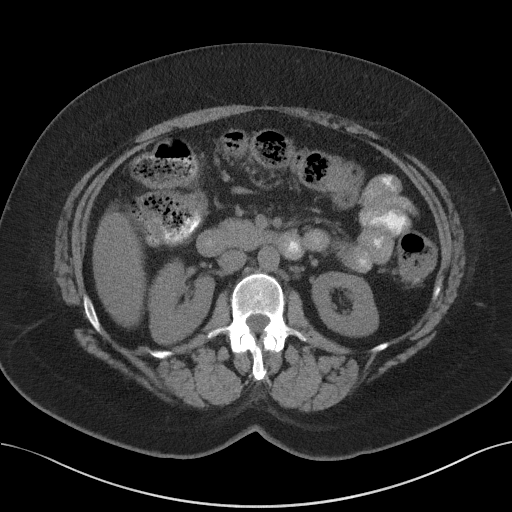
[im 64/99  bone]
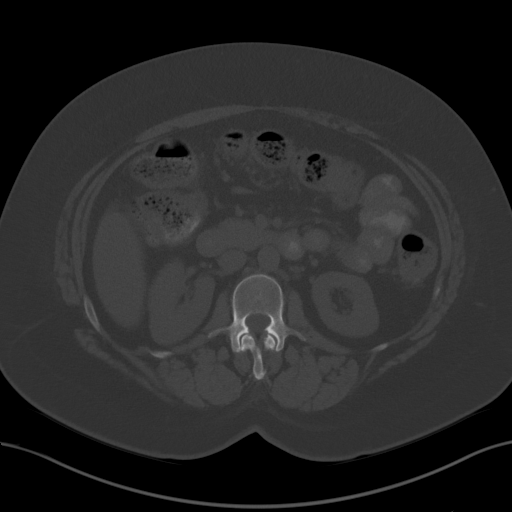
[im 73/99  soft-tissue]
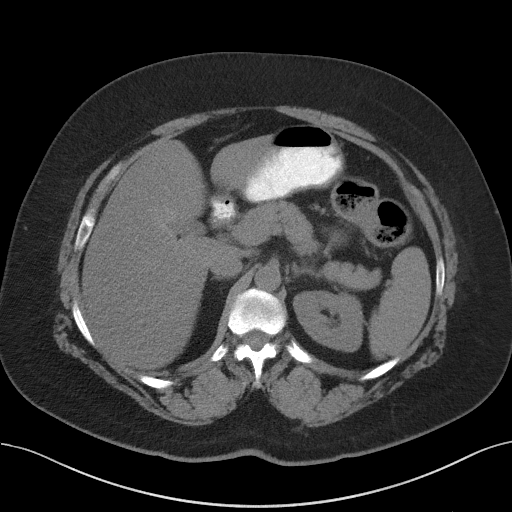
[im 77/99  soft-tissue]
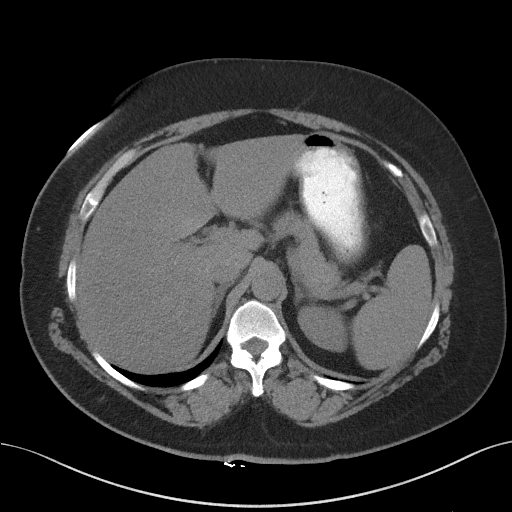
[im 81/99  lung]
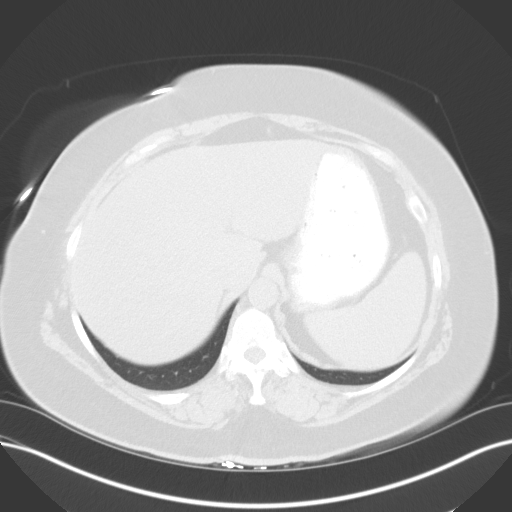
[im 86/99  soft-tissue]
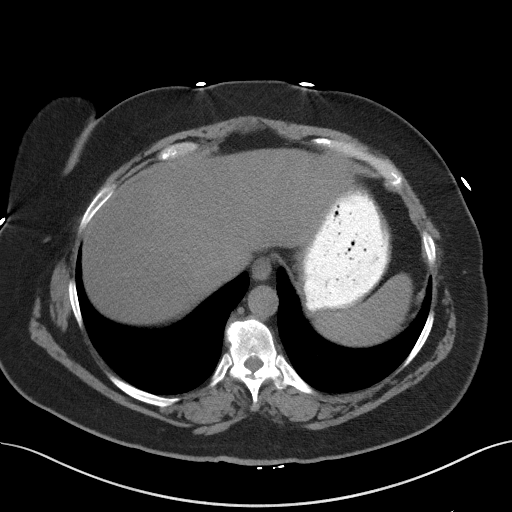
[im 86/99  lung]
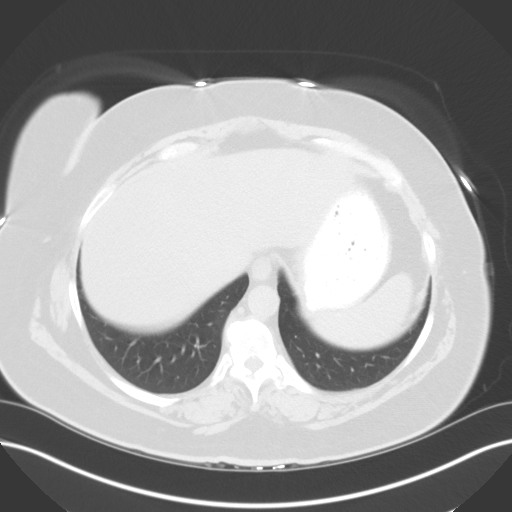
[im 90/99  lung]
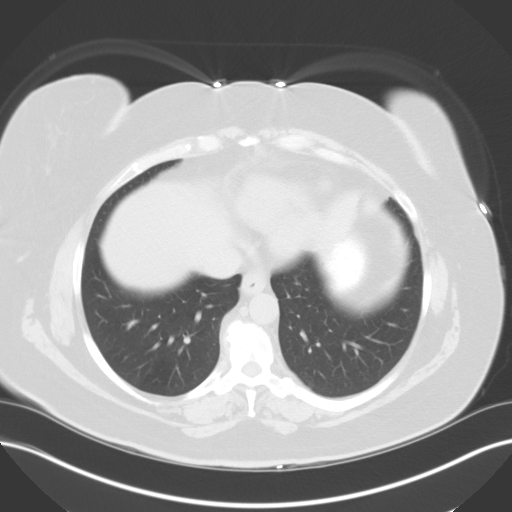
[im 94/99  soft-tissue]
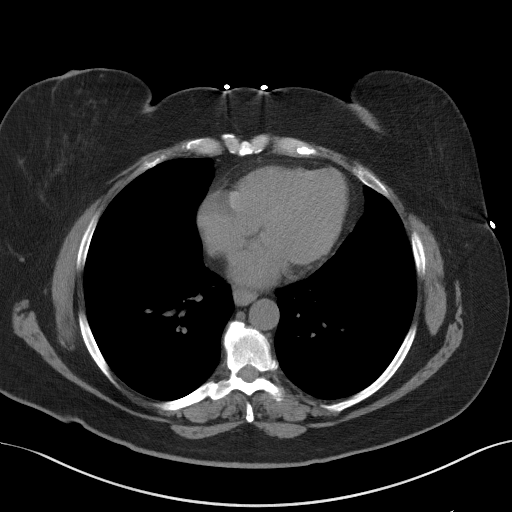
[im 94/99  lung]
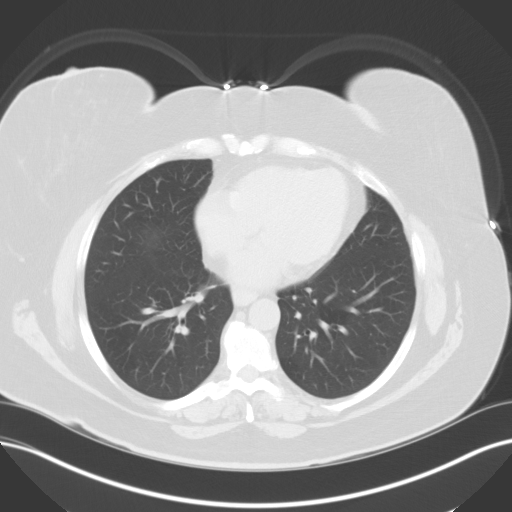

[15 of 32 positions shown; findings below may reference images not displayed]

FINDINGS: Lower chest: A 3 mm calcified nodule is noted in the right lower
lobe. There is no airspace consolidation or pleural effusion.

Hepatobiliary: Diffusely decreased attenuation of the liver is
consistent with steatosis. The gallbladder is unremarkable. No
biliary dilatation is seen.

Pancreas: Unremarkable.

Spleen: Unremarkable.

Adrenals/Urinary Tract: Unremarkable adrenal glands. No evidence of
renal mass, calculi, or hydronephrosis. Largely decompressed bladder
without abnormality identified.

Stomach/Bowel: There is no evidence of bowel obstruction. Sigmoid
colon diverticulosis is present with mild wall thickening and
surrounding inflammatory stranding about the mid sigmoid colon. No
fluid collection or intraperitoneal free air is identified.
Scattered descending colon diverticula are also noted. The appendix
is unremarkable.

Vascular/Lymphatic: Normal caliber of the abdominal aorta. No
enlarged lymph nodes.

Reproductive: Status post hysterectomy. No adnexal masses.

Other: No intraperitoneal free fluid. No abdominal wall mass or
hernia.

Musculoskeletal: Moderate L4-5 facet arthrosis with grade 1
anterolisthesis and mild disc space narrowing.
IMPRESSION: 1. Sigmoid colon diverticulitis.  No abscess.
2. Hepatic steatosis.

## 2020-04-11 ENCOUNTER — Other Ambulatory Visit: Payer: Self-pay | Admitting: Physician Assistant

## 2020-04-11 DIAGNOSIS — F4322 Adjustment disorder with anxiety: Secondary | ICD-10-CM

## 2020-04-11 MED ORDER — VENLAFAXINE HCL ER 75 MG PO CP24: 75.0000 mg | ORAL_CAPSULE | Freq: Every day | ORAL | 0 refills | Status: DC

## 2020-04-11 NOTE — Telephone Encounter (Signed)
Pt called in for refill for venlafaxine XR (EFFEXOR-XR) 75 MG 24 hr capsule     Pharmacy:  [place], [place] Phone:  [phone]  Fax:  [phone]

## 2020-04-11 NOTE — Telephone Encounter (Signed)
Requested medication (s) are due for refill today: no  Requested medication (s) are on the active medication list: yes  Last refill:  04/25/2019  Future visit scheduled: yes  Notes to clinic:  Patient last seen in 2019 Has appointment coming up Review for short supply   Requested Prescriptions  Pending Prescriptions Disp Refills   venlafaxine XR (EFFEXOR-XR) 75 MG 24 hr capsule 90 capsule 3    Sig: Take 1 capsule (75 mg total) by mouth daily.      Psychiatry: Antidepressants - SNRI - desvenlafaxine & venlafaxine Failed - 04/11/2020  9:57 AM      Failed - LDL in normal range and within 360 days    LDL Calculated  Date Value Ref Range Status  05/03/2018 144 (H) 0 - 99 mg/dL Final          Failed - Total Cholesterol in normal range and within 360 days    Cholesterol, Total  Date Value Ref Range Status  05/03/2018 229 (H) 100 - 199 mg/dL Final          Failed - Triglycerides in normal range and within 360 days    Triglycerides  Date Value Ref Range Status  05/03/2018 91 0 - 149 mg/dL Final          Failed - Valid encounter within last 6 months    Recent Outpatient Visits           1 year ago Adjustment disorder with anxious mood   [place], [place], [place]   2 years ago Diarrhea, unspecified type   [place], [facility] [place], [place]   3 years ago Annual physical exam   [facility], [place]   4 years ago Annual physical exam   [facility] Margarita Rana, [place]              Passed - Last BP in normal range    BP Readings from Last 1 Encounters:  05/03/18 120/80

## 2020-04-27 ENCOUNTER — Other Ambulatory Visit (HOSPITAL_COMMUNITY)
Admission: RE | Admit: 2020-04-27 | Discharge: 2020-04-27 | Disposition: A | Discharge status: Home / Self Care | Payer: BC Managed Care – PPO | Source: Ambulatory Visit | Attending: Physician Assistant | Admitting: Physician Assistant

## 2020-04-27 ENCOUNTER — Other Ambulatory Visit: Payer: Self-pay

## 2020-04-27 ENCOUNTER — Ambulatory Visit (INDEPENDENT_AMBULATORY_CARE_PROVIDER_SITE_OTHER): Payer: BC Managed Care – PPO | Admitting: Physician Assistant

## 2020-04-27 ENCOUNTER — Encounter: Payer: Self-pay | Admitting: Physician Assistant

## 2020-04-27 VITALS — BP 104/69 | HR 73 | Temp 97.1°F | Resp 16 | Ht 62.0 in | Wt 192.0 lb

## 2020-04-27 DIAGNOSIS — Z6835 Body mass index (BMI) 35.0-35.9, adult: Secondary | ICD-10-CM

## 2020-04-27 DIAGNOSIS — Z0000 Encounter for general adult medical examination without abnormal findings: Secondary | ICD-10-CM

## 2020-04-27 DIAGNOSIS — Z1272 Encounter for screening for malignant neoplasm of vagina: Secondary | ICD-10-CM | POA: Diagnosis not present

## 2020-04-27 DIAGNOSIS — E7800 Pure hypercholesterolemia, unspecified: Secondary | ICD-10-CM

## 2020-04-27 DIAGNOSIS — Z1329 Encounter for screening for other suspected endocrine disorder: Secondary | ICD-10-CM | POA: Diagnosis not present

## 2020-04-27 DIAGNOSIS — Z1231 Encounter for screening mammogram for malignant neoplasm of breast: Secondary | ICD-10-CM | POA: Diagnosis not present

## 2020-04-27 DIAGNOSIS — K5100 Ulcerative (chronic) pancolitis without complications: Secondary | ICD-10-CM

## 2020-04-27 DIAGNOSIS — F4322 Adjustment disorder with anxiety: Secondary | ICD-10-CM

## 2020-04-27 DIAGNOSIS — R7303 Prediabetes: Secondary | ICD-10-CM

## 2020-04-27 MED ORDER — VENLAFAXINE HCL ER 75 MG PO CP24: 75.0000 mg | ORAL_CAPSULE | Freq: Every day | ORAL | 3 refills | Status: DC

## 2020-04-27 NOTE — Patient Instructions (Signed)
[facility] at [place],  [place]  [address] Get Driving Directions Main: [phone]   Health Maintenance for Postmenopausal Women Menopause is a normal process in which your ability to get pregnant comes to an end. This process happens slowly over many months or years, usually between the ages of 19 and 58. Menopause is complete when you have missed your menstrual periods for 12 months. It is important to talk with your health care provider about some of the most common conditions that affect women after menopause (postmenopausal women). These include heart disease, cancer, and bone loss (osteoporosis). Adopting a healthy lifestyle and getting preventive care can help to promote your health and wellness. The actions you take can also lower your chances of developing some of these common conditions. What should I know about menopause? During menopause, you may get a number of symptoms, such as:  Hot flashes. These can be moderate or severe.  Night sweats.  Decrease in sex drive.  Mood swings.  Headaches.  Tiredness.  Irritability.  Memory problems.  Insomnia. Choosing to treat or not to treat these symptoms is a decision that you make with your health care provider. Do I need hormone replacement therapy?  Hormone replacement therapy is effective in treating symptoms that are caused by menopause, such as hot flashes and night sweats.  Hormone replacement carries certain risks, especially as you become older. If you are thinking about using estrogen or estrogen with progestin, discuss the benefits and risks with your health care provider. What is my risk for heart disease and stroke? The risk of heart disease, heart attack, and stroke increases as you age. One of the causes may be a change in the body's hormones during menopause. This can affect how your body uses dietary fats, triglycerides, and cholesterol. Heart attack and stroke  are medical emergencies. There are many things that you can do to help prevent heart disease and stroke. Watch your blood pressure  High blood pressure causes heart disease and increases the risk of stroke. This is more likely to develop in people who have high blood pressure readings, are of African descent, or are overweight.  Have your blood pressure checked: ? Every 3-5 years if you are 36-35 years of age. ? Every year if you are 33 years old or older. Eat a healthy diet   Eat a diet that includes plenty of vegetables, fruits, low-fat dairy products, and lean protein.  Do not eat a lot of foods that are high in solid fats, added sugars, or sodium. Get regular exercise Get regular exercise. This is one of the most important things you can do for your health. Most adults should:  Try to exercise for at least 150 minutes each week. The exercise should increase your heart rate and make you sweat (moderate-intensity exercise).  Try to do strengthening exercises at least twice each week. Do these in addition to the moderate-intensity exercise.  Spend less time sitting. Even light physical activity can be beneficial. Other tips  Work with your health care provider to achieve or maintain a healthy weight.  Do not use any products that contain nicotine or tobacco, such as cigarettes, e-cigarettes, and chewing tobacco. If you need help quitting, ask your health care provider.  Know your numbers. Ask your health care provider to check your cholesterol and your blood sugar (glucose). Continue to have your blood tested as directed by your health care provider. Do I need screening for  cancer? Depending on your health history and family history, you may need to have cancer screening at different stages of your life. This may include screening for:  Breast cancer.  Cervical cancer.  Lung cancer.  Colorectal cancer. What is my risk for osteoporosis? After menopause, you may be at increased  risk for osteoporosis. Osteoporosis is a condition in which bone destruction happens more quickly than new bone creation. To help prevent osteoporosis or the bone fractures that can happen because of osteoporosis, you may take the following actions:  If you are 28-51 years old, get at least 1,000 mg of calcium and at least 600 mg of vitamin D per day.  If you are older than age 76 but younger than age 61, get at least 1,200 mg of calcium and at least 600 mg of vitamin D per day.  If you are older than age 25, get at least 1,200 mg of calcium and at least 800 mg of vitamin D per day. Smoking and drinking excessive alcohol increase the risk of osteoporosis. Eat foods that are rich in calcium and vitamin D, and do weight-bearing exercises several times each week as directed by your health care provider. How does menopause affect my mental health? Depression may occur at any age, but it is more common as you become older. Common symptoms of depression include:  Low or sad mood.  Changes in sleep patterns.  Changes in appetite or eating patterns.  Feeling an overall lack of motivation or enjoyment of activities that you previously enjoyed.  Frequent crying spells. Talk with your health care provider if you think that you are experiencing depression. General instructions See your health care provider for regular wellness exams and vaccines. This may include:  Scheduling regular health, dental, and eye exams.  Getting and maintaining your vaccines. These include: ? Influenza vaccine. Get this vaccine each year before the flu season begins. ? Pneumonia vaccine. ? Shingles vaccine. ? Tetanus, diphtheria, and pertussis (Tdap) booster vaccine. Your health care provider may also recommend other immunizations. Tell your health care provider if you have ever been abused or do not feel safe at home. Summary  Menopause is a normal process in which your ability to get pregnant comes to an  end.  This condition causes hot flashes, night sweats, decreased interest in sex, mood swings, headaches, or lack of sleep.  Treatment for this condition may include hormone replacement therapy.  Take actions to keep yourself healthy, including exercising regularly, eating a healthy diet, watching your weight, and checking your blood pressure and blood sugar levels.  Get screened for cancer and depression. Make sure that you are up to date with all your vaccines. This information is not intended to replace advice given to you by your health care provider. Make sure you discuss any questions you have with your health care provider. Document Revised: 12/08/2018 Document Reviewed: 12/08/2018 Elsevier Patient Education  2020 [employer].

## 2020-04-27 NOTE — Progress Notes (Signed)
Complete physical exam   Patient: Teresa Pitts   DOB: [birth year]   59 y.o. Female  MRN: [id number]   Today's healthcare provider: Mar Daring, PA-C   Chief Complaint  Patient presents with  . Annual Exam   Subjective    Teresa Pitts is a 59 y.o. female who presents today for a complete physical exam.  She reports consuming a doing 6 meals a day with optavia plan diet. Home exercise routine includes walking 1 hrs per day. She generally feels well. She reports sleeping well. She does not have additional problems to discuss today.  HPI  09/24/17-Cplonoscopy  Past Medical History:  Diagnosis Date  . Allergy   . Depression    Past Surgical History:  Procedure Laterality Date  . ABDOMINAL HYSTERECTOMY  2001  . COLONOSCOPY WITH PROPOFOL N/A 09/24/2017   Procedure: COLONOSCOPY WITH PROPOFOL;  Surgeon: Jonathon Bellows, MD;  Location: [facility] ENDOSCOPY;  Service: Gastroenterology;  Laterality: N/A;  . TONSILLECTOMY AND ADENOIDECTOMY  1980   Social History   Socioeconomic History  . Marital status: Married    Spouse name: Not on file  . Number of children: 2  . Years of education: Not on file  . Highest education level: Not on file  Occupational History  . Not on file  Tobacco Use  . Smoking status: Never Smoker  . Smokeless tobacco: Never Used  Substance and Sexual Activity  . Alcohol use: No  . Drug use: No  . Sexual activity: Yes    Birth control/protection: Surgical  Other Topics Concern  . Not on file  Social History Narrative  . Not on file   Social Determinants of Health   Financial Resource Strain:   . Difficulty of Paying Living Expenses:   Food Insecurity:   . Worried About Charity fundraiser in the Last Year:   . Arboriculturist in the Last Year:   Transportation Needs:   . Film/video editor (Medical):   Marland Kitchen Lack of Transportation (Non-Medical):   Physical Activity:   . Days of Exercise per Week:   . Minutes of Exercise per Session:   Stress:     . Feeling of Stress :   Social Connections:   . Frequency of Communication with Friends and Family:   . Frequency of Social Gatherings with Friends and Family:   . Attends Religious Services:   . Active Member of Clubs or Organizations:   . Attends Archivist Meetings:   Marland Kitchen Marital Status:   Intimate Partner Violence:   . Fear of Current or Ex-Partner:   . Emotionally Abused:   Marland Kitchen Physically Abused:   . Sexually Abused:    Family Status  Relation Name Status  . Mother  Alive  . Father  Alive  . Brother  Deceased       suicide  . Daughter  Alive  . Son  Alive   Family History  Problem Relation Age of Onset  . Cancer Mother        uterus  . Diabetes Father   . Hypertension Father    No Known Allergies  Patient Care Team: Mar Daring, PA-C as PCP - General (Family Medicine)   Medications: Outpatient Medications Prior to Visit  Medication Sig  . venlafaxine XR (EFFEXOR-XR) 75 MG 24 hr capsule Take 1 capsule (75 mg total) by mouth daily.  . Vitamin D, Ergocalciferol, (DRISDOL) 50000 units CAPS capsule Take 1 capsule weekly (every 7  days) for 8 weeks.   No facility-administered medications prior to visit.    Review of Systems  Constitutional: Negative.   HENT: Negative.   Eyes: Negative.   Respiratory: Negative.   Cardiovascular: Negative.   Gastrointestinal: Negative.   Endocrine: Negative.   Genitourinary: Negative.   Musculoskeletal: Negative.   Skin: Negative.   Allergic/Immunologic: Negative.   Hematological: Negative.   Psychiatric/Behavioral: Negative.     Last CBC Lab Results  Component Value Date   WBC 4.6 03/29/2018   HGB 12.8 03/29/2018   HCT 39.5 03/29/2018   MCV 81.9 03/29/2018   MCH 26.5 03/29/2018   RDW 14.7 (H) 03/29/2018   PLT 228 45/80/9983   Last metabolic panel Lab Results  Component Value Date   GLUCOSE 99 03/29/2018   NA 141 03/29/2018   K 4.1 03/29/2018   CL 106 03/29/2018   CO2 27 03/29/2018   BUN 13  03/29/2018   CREATININE 0.87 03/29/2018   GFRNONAA >60 03/29/2018   GFRAA >60 03/29/2018   CALCIUM 8.8 (L) 03/29/2018   PROT 7.1 03/29/2018   ALBUMIN 3.6 03/29/2018   LABGLOB 2.6 01/01/2017   AGRATIO 1.7 01/01/2017   BILITOT 0.4 03/29/2018   ALKPHOS 50 03/29/2018   AST 19 03/29/2018   ALT 17 03/29/2018   ANIONGAP 8 03/29/2018   Last lipids Lab Results  Component Value Date   CHOL 229 (H) 05/03/2018   HDL 67 05/03/2018   LDLCALC 144 (H) 05/03/2018   TRIG 91 05/03/2018   CHOLHDL 3.4 05/03/2018   Last hemoglobin A1c Lab Results  Component Value Date   HGBA1C 5.7 (H) 01/01/2017      Objective    BP 104/69 (BP Location: Left Arm, Patient Position: Sitting, Cuff Size: Large)   Pulse 73   Temp (!) 97.1 F (36.2 C) (Temporal)   Resp 16   Ht 5' 2"  (1.575 m)   Wt 192 lb (87.1 kg)   BMI 35.12 kg/m  BP Readings from Last 3 Encounters:  04/27/20 104/69  05/03/18 120/80  04/12/18 108/65   Wt Readings from Last 3 Encounters:  04/27/20 192 lb (87.1 kg)  05/03/18 243 lb 12.8 oz (110.6 kg)  04/12/18 247 lb 9.6 oz (112.3 kg)      Physical Exam Vitals reviewed. Exam conducted with a chaperone present.  Constitutional:      General: She is not in acute distress.    Appearance: Normal appearance. She is well-developed. She is obese. She is not ill-appearing or diaphoretic.  HENT:     Head: Normocephalic and atraumatic.     Right Ear: Tympanic membrane, ear canal and external ear normal.     Left Ear: Tympanic membrane, ear canal and external ear normal.  Eyes:     General: No scleral icterus.       Right eye: No discharge.        Left eye: No discharge.     Extraocular Movements: Extraocular movements intact.     Conjunctiva/sclera: Conjunctivae normal.     Pupils: Pupils are equal, round, and reactive to light.  Neck:     Thyroid: No thyromegaly.     Vascular: No carotid bruit or JVD.     Trachea: No tracheal deviation.  Cardiovascular:     Rate and Rhythm: Normal  rate and regular rhythm.     Pulses: Normal pulses.     Heart sounds: Normal heart sounds. No murmur. No friction rub. No gallop.   Pulmonary:     Effort:  Pulmonary effort is normal. No respiratory distress.     Breath sounds: Normal breath sounds. No wheezing or rales.  Chest:     Chest wall: No tenderness.  Abdominal:     General: Abdomen is flat. Bowel sounds are normal. There is no distension.     Palpations: Abdomen is soft. There is no mass.     Tenderness: There is no abdominal tenderness. There is no guarding or rebound.     Hernia: There is no hernia in the left inguinal area or right inguinal area.  Genitourinary:    General: Normal vulva.     Pubic Area: No rash.      Labia:        Right: No rash, tenderness, lesion or injury.        Left: No rash, tenderness, lesion or injury.      Vagina: Normal. No vaginal discharge, erythema or tenderness.     Uterus: Absent.      Adnexa:        Right: No mass, tenderness or fullness.         Left: No mass, tenderness or fullness.       Rectum: Normal.  Musculoskeletal:        General: No tenderness. Normal range of motion.     Cervical back: Normal range of motion and neck supple.  Lymphadenopathy:     Cervical: No cervical adenopathy.     Lower Body: No right inguinal adenopathy. No left inguinal adenopathy.  Skin:    General: Skin is warm and dry.     Capillary Refill: Capillary refill takes less than 2 seconds.     Findings: No rash.  Neurological:     General: No focal deficit present.     Mental Status: She is alert and oriented to person, place, and time. Mental status is at baseline.  Psychiatric:        Mood and Affect: Mood normal.        Behavior: Behavior normal.        Thought Content: Thought content normal.        Judgment: Judgment normal.      Depression Screen  PHQ 2/9 Scores 04/27/2020 05/03/2018 01/01/2017  PHQ - 2 Score 0 0 0  PHQ- 9 Score - 0 -    No results found for any visits on 04/27/20.   Assessment & Plan    Routine Health Maintenance and Physical Exam  Exercise Activities and Dietary recommendations Goals    . Exercise 150 minutes per week (moderate activity)       Immunization History  Administered Date(s) Administered  . Influenza Split 12/12/2010  . Influenza,inj,Quad PF,6+ Mos 09/25/2014, 09/26/2015  . Td 05/03/2018  . Tdap 09/03/2007    Health Maintenance  Topic Date Due  . COVID-19 Vaccine (1) Never done  . MAMMOGRAM  Never done  . PAP SMEAR-Modifier  09/25/2017  . INFLUENZA VACCINE  07/29/2020  . COLONOSCOPY  09/25/2027  . TETANUS/TDAP  05/03/2028  . Hepatitis C Screening  Completed  . HIV Screening  Completed    Discussed health benefits of physical activity, and encouraged her to engage in regular exercise appropriate for her age and condition.  1. Routine general medical examination at a health care facility Normal physical exam today. Will check labs as below and f/u pending lab results. If labs are stable and WNL she will not need to have these rechecked for one year at her next annual physical exam. She is  to call the office in the meantime if she has any acute issue, questions or concerns.  2. Breast cancer screening by mammogram Breast exam today was normal. There is no family history of breast cancer. She does perform regular self breast exams. Mammogram was ordered as below. Information for [facility] Breast clinic was given to patient so she may schedule her mammogram at her convenience. - MM 3D Screening Breast Bilateral - [place]; Future  3. Screening for vaginal cancer S/P Hysterectomy. Pap collected today. Will send as below and f/u pending results. - Cytology - PAP ([place])  4. Screening for thyroid disorder Will check labs as below and f/u pending results. - TSH  5. Pre-diabetes Diet controlled. Will check labs as below and f/u pending results. - Comprehensive metabolic panel - Hemoglobin A1c - CBC  w/Diff/Platelet  6. Hypercholesterolemia Diet controlled. Will check labs as below and f/u pending results. - Comprehensive metabolic panel - Lipid panel - Hemoglobin A1c - CBC w/Diff/Platelet  7. Class 2 severe obesity due to excess calories with serious comorbidity and body mass index (BMI) of 35.0 to 35.9 in adult [facility]) Patient has lost from 247 pounds to 192 pounds. Counseled patient on healthy lifestyle modifications including dieting and exercise.  - Comprehensive metabolic panel - Hemoglobin A1c - CBC w/Diff/Platelet  8. Ulcerative pancolitis without complication ([place]) Improved with new diet. Followed by Dr. Vicente Males, [facility].  9. Adjustment disorder with anxious mood Stable. Diagnosis pulled for medication refill. Continue current medical treatment plan. - venlafaxine XR (EFFEXOR-XR) 75 MG 24 hr capsule; Take 1 capsule (75 mg total) by mouth daily.  Dispense: 90 capsule; Refill: 3   No follow-ups on file.     Reynolds Bowl, PA-C, have reviewed all documentation for this visit. The documentation on 04/27/20 for the exam, diagnosis, procedures, and orders are all accurate and complete.   Rubye Beach  [facility] [phone] (phone) [phone] (fax)  [place]

## 2020-04-28 LAB — COMPREHENSIVE METABOLIC PANEL
ALT: 13 IU/L (ref 0–32)
AST: 14 IU/L (ref 0–40)
Albumin/Globulin Ratio: 1.7 (ref 1.2–2.2)
Albumin: 4.2 g/dL (ref 3.8–4.9)
Alkaline Phosphatase: 55 IU/L (ref 39–117)
BUN/Creatinine Ratio: 17 (ref 9–23)
BUN: 15 mg/dL (ref 6–24)
Bilirubin Total: 0.2 mg/dL (ref 0.0–1.2)
CO2: 24 mmol/L (ref 20–29)
Calcium: 9.2 mg/dL (ref 8.7–10.2)
Chloride: 101 mmol/L (ref 96–106)
Creatinine, Ser: 0.88 mg/dL (ref 0.57–1.00)
GFR calc Af Amer: 84 mL/min/{1.73_m2} (ref >59)
GFR calc non Af Amer: 73 mL/min/{1.73_m2} (ref >59)
Globulin, Total: 2.5 g/dL (ref 1.5–4.5)
Glucose: 84 mg/dL (ref 65–99)
Potassium: 4.6 mmol/L (ref 3.5–5.2)
Sodium: 141 mmol/L (ref 134–144)
Total Protein: 6.7 g/dL (ref 6.0–8.5)

## 2020-04-28 LAB — LIPID PANEL
Chol/HDL Ratio: 3.7 ratio (ref 0.0–4.4)
Cholesterol, Total: 225 mg/dL — ABNORMAL HIGH (ref 100–199)
HDL: 61 mg/dL (ref >39)
LDL Chol Calc (NIH): 151 mg/dL — ABNORMAL HIGH (ref 0–99)
Triglycerides: 77 mg/dL (ref 0–149)
VLDL Cholesterol Cal: 13 mg/dL (ref 5–40)

## 2020-04-28 LAB — CBC WITH DIFFERENTIAL/PLATELET
Basophils Absolute: 0 10*3/uL (ref 0.0–0.2)
Basos: 1 %
EOS (ABSOLUTE): 0.1 10*3/uL (ref 0.0–0.4)
Eos: 1 %
Hematocrit: 42.3 % (ref 34.0–46.6)
Hemoglobin: 13.3 g/dL (ref 11.1–15.9)
Immature Grans (Abs): 0 10*3/uL (ref 0.0–0.1)
Immature Granulocytes: 0 %
Lymphocytes Absolute: 1.1 10*3/uL (ref 0.7–3.1)
Lymphs: 23 %
MCH: 25.7 pg — ABNORMAL LOW (ref 26.6–33.0)
MCHC: 31.4 g/dL — ABNORMAL LOW (ref 31.5–35.7)
MCV: 82 fL (ref 79–97)
Monocytes Absolute: 0.4 10*3/uL (ref 0.1–0.9)
Monocytes: 8 %
Neutrophils Absolute: 3.4 10*3/uL (ref 1.4–7.0)
Neutrophils: 67 %
Platelets: 253 10*3/uL (ref 150–450)
RBC: 5.17 x10E6/uL (ref 3.77–5.28)
RDW: 14.6 % (ref 11.7–15.4)
WBC: 5 10*3/uL (ref 3.4–10.8)

## 2020-04-28 LAB — HEMOGLOBIN A1C
Est. average glucose Bld gHb Est-mCnc: 105 mg/dL
Hgb A1c MFr Bld: 5.3 % (ref 4.8–5.6)

## 2020-04-28 LAB — TSH: TSH: 2.79 u[IU]/mL (ref 0.450–4.500)

## 2020-04-30 ENCOUNTER — Telehealth: Payer: Self-pay

## 2020-04-30 NOTE — Telephone Encounter (Signed)
Patient advised as directed below. 

## 2020-04-30 NOTE — Telephone Encounter (Signed)
-----   Message from Mar Daring, [place] sent at 04/30/2020 12:58 PM EDT ----- Sugar is normal. Kidney and liver function are normal. Sodium, potassium and calcium are normal. Cholesterol remains elevated but is stable. Thyroid is normal. A1c (3 month snapshot of blood sugar) is normal. Blood count is normal. Pap is still pending.

## 2020-05-01 LAB — CYTOLOGY - PAP
Adequacy: ABNORMAL
Comment: NEGATIVE

## 2020-05-02 ENCOUNTER — Telehealth: Payer: Self-pay

## 2020-05-02 NOTE — Telephone Encounter (Signed)
Pt advised.  She agreed to wait until next year to repeat her pap smear.   Thanks,   -Mickel Baas

## 2020-05-02 NOTE — Telephone Encounter (Signed)
-----   Message from Mar Daring, [place] sent at 05/02/2020 10:18 AM EDT ----- Pap was unsatisfactory due to inflammatory cells. This is a common finding with post-menopausal women. It is recommended to repeat pap. HPV testing was negative which is reassuring. We could repeat pap in a few weeks, or with having a negative HPV would be safe to just repeat it with your annual physical next year. Either are appropriate.

## 2021-01-08 ENCOUNTER — Telehealth: Payer: Self-pay

## 2021-01-08 NOTE — Telephone Encounter (Signed)
Copied from [place] [id number]. Topic: General - Inquiry >> Jan 08, 2021 11:47 AM Gillis Ends D wrote: Reason for CRM: Patient tested positive for covid and would like to know if she can be prescribed some medication. She can be reached at [phone]. Please nurse

## 2021-01-09 NOTE — Telephone Encounter (Signed)
N/A not able to [facility] voicemail not set up. OK for [facility] nurse to give providers message.

## 2021-01-09 NOTE — Telephone Encounter (Signed)
She would need a virtual appt to see what she needs. Covid 19 is viral and is treated more symptomatically.  Below are OTC medications we recommend:  Vit C 582m twice daily Quercertin 2703-500XFtwice daily Zinc 75-1066mdaily Melatonin 3-6 mg at bedtime Vit D3 1000-2000 IU daily Aspirin 81 mg daily with food Optional: Famotidine 2054maily Also can add tylenol/ibuprofen as needed for fevers and body aches May add Mucinex or Mucinex DM as needed for cough/congestion

## 2021-01-11 NOTE — Telephone Encounter (Signed)
NA voice mail not set up.

## 2021-01-14 NOTE — Telephone Encounter (Signed)
Voice mail not set up. Patient has been seen at [facility] urgent care.

## 2021-03-23 ENCOUNTER — Other Ambulatory Visit: Payer: Self-pay | Admitting: Physician Assistant

## 2021-03-23 DIAGNOSIS — F4322 Adjustment disorder with anxiety: Secondary | ICD-10-CM

## 2021-03-23 NOTE — Telephone Encounter (Signed)
Future appointment for 04/27/21 on the chart. Approved for 90 day supply. Requested Prescriptions  Pending Prescriptions Disp Refills  . venlafaxine XR (EFFEXOR-XR) 75 MG 24 hr capsule [Pharmacy Med Name: VENLAFAXINE HCL ER 75 MG CAP] 90 capsule 3    Sig: TAKE 1 CAPSULE (75 MG) BY MOUTH EVERY DAY     Psychiatry: Antidepressants - SNRI - desvenlafaxine & venlafaxine Failed - 03/23/2021  8:55 AM      Failed - LDL in normal range and within 360 days    LDL Chol Calc (NIH)  Date Value Ref Range Status  04/27/2020 151 (H) 0 - 99 mg/dL Final         Failed - Total Cholesterol in normal range and within 360 days    Cholesterol, Total  Date Value Ref Range Status  04/27/2020 225 (H) 100 - 199 mg/dL Final         Failed - Valid encounter within last 6 months    Recent Outpatient Visits          11 months ago Routine general medical examination at a health care facility   [place], [place], [place]   2 years ago Adjustment disorder with anxious mood   [place], [place], [place]   3 years ago Diarrhea, unspecified type   [facility] [place], Clearnce Sorrel, [place]   4 years ago Annual physical exam   [facility] [place], Clearnce Sorrel, [place]   5 years ago Annual physical exam   [facility] Margarita Rana, MD      Future Appointments            In 1 month [place], Clearnce Sorrel, PA-C [facility]           Passed - Triglycerides in normal range and within 360 days    Triglycerides  Date Value Ref Range Status  04/27/2020 77 0 - 149 mg/dL Final         Passed - Last BP in normal range    BP Readings from Last 1 Encounters:  04/27/20 104/69

## 2021-04-19 ENCOUNTER — Other Ambulatory Visit: Payer: Self-pay | Admitting: Physician Assistant

## 2021-04-19 DIAGNOSIS — F4322 Adjustment disorder with anxiety: Secondary | ICD-10-CM

## 2021-04-29 ENCOUNTER — Encounter: Payer: Self-pay | Admitting: Physician Assistant

## 2021-07-02 ENCOUNTER — Other Ambulatory Visit: Payer: Self-pay | Admitting: Physician Assistant

## 2021-07-02 DIAGNOSIS — F4322 Adjustment disorder with anxiety: Secondary | ICD-10-CM

## 2021-07-04 NOTE — Telephone Encounter (Signed)
Pt calling and is requesting to have this medication sent out. She states that she only has 1-2 left and is traveling this weekend. Please advise.

## 2021-09-24 ENCOUNTER — Other Ambulatory Visit: Payer: Self-pay | Admitting: Family Medicine

## 2021-09-24 DIAGNOSIS — F4322 Adjustment disorder with anxiety: Secondary | ICD-10-CM

## 2021-09-25 MED ORDER — VENLAFAXINE HCL ER 75 MG PO CP24: 75.0000 mg | ORAL_CAPSULE | Freq: Every day | ORAL | 0 refills | Status: DC

## 2021-09-25 NOTE — Telephone Encounter (Signed)
Please advise refill?  LOV: 04/27/2020 NOV: 10/24/2021 Last refill: 07/04/2021 #90 w/0 refills

## 2021-09-25 NOTE — Telephone Encounter (Signed)
Pt called in to see about getting a refill to hold her til the appt she made on 10/27. Please advise.

## 2021-10-24 ENCOUNTER — Ambulatory Visit: Payer: BC Managed Care – PPO | Admitting: Family Medicine

## 2021-11-29 ENCOUNTER — Encounter: Payer: Self-pay | Admitting: Family Medicine

## 2021-11-29 ENCOUNTER — Telehealth (INDEPENDENT_AMBULATORY_CARE_PROVIDER_SITE_OTHER): Payer: BC Managed Care – PPO | Admitting: Family Medicine

## 2021-11-29 DIAGNOSIS — F4322 Adjustment disorder with anxiety: Secondary | ICD-10-CM | POA: Diagnosis not present

## 2021-11-29 DIAGNOSIS — F5101 Primary insomnia: Secondary | ICD-10-CM | POA: Diagnosis not present

## 2021-11-29 DIAGNOSIS — J988 Other specified respiratory disorders: Secondary | ICD-10-CM | POA: Insufficient documentation

## 2021-11-29 DIAGNOSIS — R509 Fever, unspecified: Secondary | ICD-10-CM | POA: Insufficient documentation

## 2021-11-29 DIAGNOSIS — F409 Phobic anxiety disorder, unspecified: Secondary | ICD-10-CM | POA: Diagnosis not present

## 2021-11-29 DIAGNOSIS — R051 Acute cough: Secondary | ICD-10-CM | POA: Insufficient documentation

## 2021-11-29 DIAGNOSIS — F4321 Adjustment disorder with depressed mood: Secondary | ICD-10-CM | POA: Diagnosis not present

## 2021-11-29 DIAGNOSIS — F4320 Adjustment disorder, unspecified: Secondary | ICD-10-CM | POA: Insufficient documentation

## 2021-11-29 MED ORDER — PREDNISONE 10 MG (21) PO TBPK: ORAL_TABLET | ORAL | 0 refills | Status: DC

## 2021-11-29 MED ORDER — GUAIFENESIN-CODEINE 100-10 MG/5ML PO SOLN: 10.0000 mL | Freq: Every evening | ORAL | 0 refills | Status: DC | PRN

## 2021-11-29 MED ORDER — GUAIFENESIN-DM 100-10 MG/5ML PO SYRP: 5.0000 mL | ORAL_SOLUTION | ORAL | 1 refills | Status: DC | PRN

## 2021-11-29 MED ORDER — TRAZODONE HCL 100 MG PO TABS: 100.0000 mg | ORAL_TABLET | Freq: Every day | ORAL | 1 refills | Status: DC

## 2021-11-29 MED ORDER — HYDROXYZINE HCL 10 MG PO TABS: 10.0000 mg | ORAL_TABLET | Freq: Three times a day (TID) | ORAL | 0 refills | Status: DC | PRN

## 2021-11-29 MED ORDER — AZITHROMYCIN 250 MG PO TABS: ORAL_TABLET | ORAL | 0 refills | Status: AC

## 2021-11-29 MED ORDER — VENLAFAXINE HCL ER 150 MG PO CP24: 150.0000 mg | ORAL_CAPSULE | Freq: Every day | ORAL | 3 refills | Status: DC

## 2021-11-29 NOTE — Assessment & Plan Note (Signed)
Likely related to ongoing viral and sinus involvement Recommend abx given longevity as well as fevering

## 2021-11-29 NOTE — Assessment & Plan Note (Signed)
D/t passing of mom Now taking more responsibilities helping with other family members

## 2021-11-29 NOTE — Progress Notes (Signed)
MyChart Video Visit    Virtual Visit via Video Note   This visit type was conducted due to national recommendations for restrictions regarding the COVID-19 Pandemic (e.g. social distancing) in an effort to limit this patient's exposure and mitigate transmission in our community. This patient is at least at moderate risk for complications without adequate follow up. This format is felt to be most appropriate for this patient at this time. Physical exam was limited by quality of the video and audio technology used for the visit.   Patient location: home, contacted on cell phone, Identified with two identifiers, as pt was unable to contact on mychart despite frequent tries Provider location: [facility]  I discussed the limitations of evaluation and management by phone appointments and the availability of in person appointments. The patient expressed understanding and agreed to proceed.  Patient: Teresa Pitts   DOB: May 31, 1961   60 y.o. Female  MRN: [id number] Visit Date: 11/29/2021  Today's healthcare provider: Gwyneth Sprout, FNP   Chief Complaint  Patient presents with   Follow-up   Anxiety   Cough   Sore Throat   Subjective    HPI  Follow up for Adjustment disorder with anxious mood:  The patient was last seen for this 8 months ago. Changes made at last visit include; Stable. Diagnosis pulled for medication refill. Continue current medical treatment plan.  She reports good compliance with treatment. She feels that condition is Improved. She is not having side effects. none  -----------------------------------------------------------------------------------------  Patient also has symptoms of cough, chest congestion, fever and sore throat x 12 days. Patient was wanting to address symptoms with provider.  Medications: Outpatient Medications Prior to Visit  Medication Sig   venlafaxine XR (EFFEXOR-XR) 75 MG 24 hr capsule Take 1 capsule (75 mg total) by mouth daily.   No  facility-administered medications prior to visit.    Review of Systems  Constitutional:  Positive for fever. Negative for appetite change, chills and fatigue.  HENT:  Positive for congestion.   Respiratory:  Positive for cough. Negative for chest tightness and shortness of breath.   Cardiovascular:  Negative for chest pain and palpitations.  Gastrointestinal:  Negative for abdominal pain, nausea and vomiting.  Neurological:  Negative for dizziness and weakness.     Objective    There were no vitals taken for this visit.   Physical Exam Neurological:     Mental Status: She is alert.       Assessment & Plan     Problem List Items Addressed This Visit       Respiratory   Congestion of respiratory tract    Ongoing concern since exposure to sick grandkids Recommend masking and hand hygiene for prolonged exposure, or if grandkids are ill       Relevant Medications   azithromycin (ZITHROMAX) 250 MG tablet   predniSONE (STERAPRED UNI-PAK 21 TAB) 10 MG (21) TBPK tablet     Other   Adjustment disorder with anxious mood - Primary    Difficulty since passing of mom Self titrate of dose to 150 Continue dose at new mg- new Rx called in Will continue medication at 150 mg daily Discussed potential side effects, incl GI upset, sexual dysfunction, increased anxiety, and SI Discussed that it can take 6-8 weeks to reach full efficacy Contracted for safety - no SI/HI Discussed synergistic effects of medications and therapy       Relevant Medications   venlafaxine XR (EFFEXOR XR) 150 MG 24 hr  capsule   Primary insomnia    Trial of trazodone; may take 1/2 tablet or 1 whole tablet if desired to assist with sleep      Relevant Medications   traZODone (DESYREL) 100 MG tablet   Acute cough    Likely related to ongoing viral and sinus involvement Recommend abx given longevity as well as fevering       Relevant Medications   guaiFENesin-codeine 100-10 MG/5ML syrup    guaiFENesin-dextromethorphan (ROBITUSSIN DM) 100-10 MG/5ML syrup   Fever and chills    Fever up to 102 with chills Continue to monitor Use OTC tylenol to assist with temperature control       Relevant Medications   azithromycin (ZITHROMAX) 250 MG tablet   Grief reaction    D/t passing of mom Now taking more responsibilities helping with other family members       Relevant Medications   venlafaxine XR (EFFEXOR XR) 150 MG 24 hr capsule   hydrOXYzine (ATARAX) 10 MG tablet   RESOLVED: Fearful mood     Return in about 3 months (around 02/27/2022) for anxiety and depression.     I discussed the assessment and treatment plan with the patient. The patient was provided an opportunity to ask questions and all were answered. The patient agreed with the plan and demonstrated an understanding of the instructions.   The patient was advised to call back or seek an in-person evaluation if the symptoms worsen or if the condition fails to improve as anticipated.  I provided 8 minutes of non-face-to-face time during this encounter. Vonna Kotyk, FNP, have reviewed all documentation for this visit. The documentation on 11/29/21 for the exam, diagnosis, procedures, and orders are all accurate and complete.   Gwyneth Sprout, [place] [phone] (phone) [phone] (fax)  [place]

## 2021-11-29 NOTE — Assessment & Plan Note (Signed)
Ongoing concern since exposure to sick grandkids Recommend masking and hand hygiene for prolonged exposure, or if grandkids are ill

## 2021-11-29 NOTE — Assessment & Plan Note (Signed)
Trial of trazodone; may take 1/2 tablet or 1 whole tablet if desired to assist with sleep

## 2021-11-29 NOTE — Assessment & Plan Note (Signed)
Fever up to 102 with chills Continue to monitor Use OTC tylenol to assist with temperature control

## 2021-11-29 NOTE — Assessment & Plan Note (Signed)
Difficulty since passing of mom Self titrate of dose to 150 Continue dose at new mg- new Rx called in Will continue medication at 150 mg daily Discussed potential side effects, incl GI upset, sexual dysfunction, increased anxiety, and SI Discussed that it can take 6-8 weeks to reach full efficacy Contracted for safety - no SI/HI Discussed synergistic effects of medications and therapy

## 2022-10-15 ENCOUNTER — Other Ambulatory Visit: Payer: Self-pay | Admitting: Family Medicine

## 2022-10-15 DIAGNOSIS — F4322 Adjustment disorder with anxiety: Secondary | ICD-10-CM

## 2022-10-15 DIAGNOSIS — F4321 Adjustment disorder with depressed mood: Secondary | ICD-10-CM

## 2022-11-07 ENCOUNTER — Ambulatory Visit (INDEPENDENT_AMBULATORY_CARE_PROVIDER_SITE_OTHER): Payer: BC Managed Care – PPO | Admitting: Family Medicine

## 2022-11-07 ENCOUNTER — Encounter: Payer: Self-pay | Admitting: Family Medicine

## 2022-11-07 VITALS — BP 150/61 | HR 100 | Temp 99.1°F | Resp 16 | Ht 63.0 in | Wt 235.0 lb

## 2022-11-07 DIAGNOSIS — R509 Fever, unspecified: Secondary | ICD-10-CM | POA: Diagnosis not present

## 2022-11-07 DIAGNOSIS — J101 Influenza due to other identified influenza virus with other respiratory manifestations: Secondary | ICD-10-CM | POA: Insufficient documentation

## 2022-11-07 DIAGNOSIS — F4322 Adjustment disorder with anxiety: Secondary | ICD-10-CM | POA: Diagnosis not present

## 2022-11-07 LAB — POCT INFLUENZA A/B
Influenza A, POC: POSITIVE — AB
Influenza B, POC: NEGATIVE

## 2022-11-07 LAB — POC COVID19 BINAXNOW: SARS Coronavirus 2 Ag: NEGATIVE

## 2022-11-07 LAB — POCT RAPID STREP A (OFFICE): Rapid Strep A Screen: NEGATIVE

## 2022-11-07 MED ORDER — OSELTAMIVIR PHOSPHATE 75 MG PO CAPS: 75.0000 mg | ORAL_CAPSULE | Freq: Two times a day (BID) | ORAL | 0 refills | Status: DC

## 2022-11-07 MED ORDER — VENLAFAXINE HCL ER 150 MG PO CP24: 150.0000 mg | ORAL_CAPSULE | Freq: Every day | ORAL | 0 refills | Status: DC

## 2022-11-07 NOTE — Progress Notes (Signed)
Established patient visit   Patient: Teresa Pitts   DOB: [birth year]   61 y.o. Female  MRN: [id number] Visit Date: 11/07/2022  Today's healthcare provider: Gwyneth Sprout, FNP  Re Introduced to nurse practitioner role and practice setting.  All questions answered.  Discussed provider/patient relationship and expectations.   I,Tiffany J Bragg,acting as a scribe for Gwyneth Sprout, FNP.,have documented all relevant documentation on the behalf of Gwyneth Sprout, FNP,as directed by  Gwyneth Sprout, FNP while in the presence of Gwyneth Sprout, FNP.   Chief Complaint  Patient presents with   Fever    Patient complains of fever, body aches, sore throat, urinary incontinence for 2 days.    Subjective    HPI HPI     Fever    Additional comments: Patient complains of fever, body aches, sore throat, urinary incontinence for 2 days.       Last edited by Smitty Knudsen, CMA on 11/07/2022  1:48 PM.      Medications: Outpatient Medications Prior to Visit  Medication Sig   [DISCONTINUED] guaiFENesin-codeine 100-10 MG/5ML syrup Take 10 mLs by mouth at bedtime and may repeat dose one time if needed.   [DISCONTINUED] guaiFENesin-dextromethorphan (ROBITUSSIN DM) 100-10 MG/5ML syrup Take 5 mLs by mouth every 4 (four) hours as needed for cough.   [DISCONTINUED] hydrOXYzine (ATARAX) 10 MG tablet Take 1 tablet (10 mg total) by mouth 3 (three) times daily as needed.   [DISCONTINUED] predniSONE (STERAPRED UNI-PAK 21 TAB) 10 MG (21) TBPK tablet Titrate as described. Take in morning to prevent sleep disturbances.   [DISCONTINUED] traZODone (DESYREL) 100 MG tablet Take 1 tablet (100 mg total) by mouth at bedtime.   [DISCONTINUED] venlafaxine XR (EFFEXOR XR) 150 MG 24 hr capsule Take 1 capsule (150 mg total) by mouth daily with breakfast.   [DISCONTINUED] venlafaxine XR (EFFEXOR-XR) 75 MG 24 hr capsule Take 1 capsule (75 mg total) by mouth daily.   No facility-administered medications prior to visit.     Review of Systems    Objective    BP (!) 150/61 (BP Location: Left Arm, Patient Position: Sitting, Cuff Size: Large)   Pulse 100   Temp 99.1 F (37.3 C)   Resp 16   Ht 5' 3"  (1.6 m)   Wt 235 lb (106.6 kg)   SpO2 97%   BMI 41.63 kg/m   Physical Exam Vitals and nursing note reviewed.  Constitutional:      General: She is not in acute distress.    Appearance: Normal appearance. She is obese. She is ill-appearing. She is not toxic-appearing or diaphoretic.  HENT:     Head: Normocephalic and atraumatic.     Right Ear: Tympanic membrane, ear canal and external ear normal.     Left Ear: Tympanic membrane, ear canal and external ear normal.     Nose: Nose normal.     Mouth/Throat:     Mouth: Mucous membranes are moist.     Pharynx: Oropharynx is clear.  Eyes:     Extraocular Movements: Extraocular movements intact.     Conjunctiva/sclera: Conjunctivae normal.     Pupils: Pupils are equal, round, and reactive to light.  Cardiovascular:     Rate and Rhythm: Regular rhythm. Tachycardia present.     Pulses: Normal pulses.     Heart sounds: Normal heart sounds. No murmur heard.    No friction rub. No gallop.  Pulmonary:     Effort: Pulmonary effort is normal.  No respiratory distress.     Breath sounds: Normal breath sounds. No stridor. No wheezing, rhonchi or rales.  Chest:     Chest wall: No tenderness.  Musculoskeletal:        General: No swelling, tenderness, deformity or signs of injury. Normal range of motion.     Cervical back: Normal range of motion and neck supple.     Right lower leg: No edema.     Left lower leg: No edema.  Skin:    General: Skin is warm and dry.     Capillary Refill: Capillary refill takes less than 2 seconds.     Coloration: Skin is not jaundiced or pale.     Findings: No bruising, erythema, lesion or rash.  Neurological:     General: No focal deficit present.     Mental Status: She is alert and oriented to person, place, and time. Mental  status is at baseline.     Cranial Nerves: No cranial nerve deficit.     Sensory: No sensory deficit.     Motor: No weakness.     Coordination: Coordination normal.  Psychiatric:        Mood and Affect: Mood normal.        Behavior: Behavior normal.        Thought Content: Thought content normal.        Judgment: Judgment normal.    Results for orders placed or performed in visit on 11/07/22  POCT rapid strep A  Result Value Ref Range   Rapid Strep A Screen Negative Negative  POCT Influenza A/B  Result Value Ref Range   Influenza A, POC Positive (A) Negative   Influenza B, POC Negative Negative  POC COVID-19  Result Value Ref Range   SARS Coronavirus 2 Ag Negative Negative    Assessment & Plan     Problem List Items Addressed This Visit       Respiratory   Influenza A    Acute, febrile, ill feeling Temps up to 103 Request for Tamiflu to assist with supportive care       Relevant Medications   oseltamivir (TAMIFLU) 75 MG capsule     Other   Adjustment disorder with anxious mood    Chronic, stable Request for medication refill       Relevant Medications   venlafaxine XR (EFFEXOR XR) 150 MG 24 hr capsule   Fever - Primary    Acute illness, fevers up to 103. NSAIDs occasionally help Flu + Covid - Strep - Likely tachycardia with hypertension in setting of illness Follow up BP and HR at CPE       Relevant Orders   POCT rapid strep A (Completed)   POCT Influenza A/B (Completed)   POC COVID-19 (Completed)   Return in about 2 months (around 01/07/2023) for annual examination.     Vonna Kotyk, FNP, have reviewed all documentation for this visit. The documentation on 11/07/22 for the exam, diagnosis, procedures, and orders are all accurate and complete.  Gwyneth Sprout, [place] [phone] (phone) [phone] (fax)  [place]

## 2022-11-07 NOTE — Assessment & Plan Note (Signed)
Acute illness, fevers up to 103. NSAIDs occasionally help Flu + Covid - Strep - Likely tachycardia with hypertension in setting of illness Follow up BP and HR at CPE

## 2022-11-07 NOTE — Assessment & Plan Note (Signed)
Acute, febrile, ill feeling Temps up to 103 Request for Tamiflu to assist with supportive care

## 2022-11-07 NOTE — Assessment & Plan Note (Signed)
Chronic, stable Request for medication refill

## 2023-01-05 ENCOUNTER — Encounter: Payer: BC Managed Care – PPO | Admitting: Family Medicine

## 2023-01-29 ENCOUNTER — Other Ambulatory Visit: Payer: Self-pay | Admitting: Family Medicine

## 2023-01-29 DIAGNOSIS — F4322 Adjustment disorder with anxiety: Secondary | ICD-10-CM

## 2023-01-29 NOTE — Telephone Encounter (Signed)
Unable to refill per protocol, last refill by provider 01/29/23, will refuse duplicate request.  Requested Prescriptions  Pending Prescriptions Disp Refills   venlafaxine XR (EFFEXOR-XR) 150 MG 24 hr capsule 90 capsule 0     Psychiatry: Antidepressants - SNRI - desvenlafaxine & venlafaxine Failed - 01/29/2023 12:06 PM      Failed - Cr in normal range and within 360 days    Creatinine, Ser  Date Value Ref Range Status  04/27/2020 0.88 0.57 - 1.00 mg/dL Final         Failed - Last BP in normal range    BP Readings from Last 1 Encounters:  11/07/22 (!) 150/61         Failed - Lipid Panel in normal range within the last 12 months    Cholesterol, Total  Date Value Ref Range Status  04/27/2020 225 (H) 100 - 199 mg/dL Final   LDL Chol Calc (NIH)  Date Value Ref Range Status  04/27/2020 151 (H) 0 - 99 mg/dL Final   HDL  Date Value Ref Range Status  04/27/2020 61 >39 mg/dL Final   Triglycerides  Date Value Ref Range Status  04/27/2020 77 0 - 149 mg/dL Final         Passed - Valid encounter within last 6 months    Recent Outpatient Visits           2 months ago Fever, unspecified fever cause   [place] T[place] Joe T, FNP   1 year ago Adjustment disorder with anxious mood   [place] Gwyneth Sprout, FNP   2 years ago Routine general medical examination at a health care facility   [place], [place], [place]   4 years ago Adjustment disorder with anxious mood   [place], [place], [place]   5 years ago Diarrhea, unspecified type   [facility] [place], [place], [place]       Future Appointments             In 3 weeks Gwyneth Sprout, [place], [place]

## 2023-01-29 NOTE — Telephone Encounter (Signed)
Medication Refill - Medication: venlafaxine XR (EFFEXOR XR) 150 MG 24 hr capsule   Has the patient contacted their pharmacy? Yes.     Preferred Pharmacy (with phone number or street name):  [place], [place] - [place] Phone: [phone]  Fax: [phone]     Has the patient been seen for an appointment in the last year OR does the patient have an upcoming appointment? Yes.      The patient thought she still had a refill left and is going out of town on Sunday and needs this for when she goes. Please assist patient further

## 2023-02-23 NOTE — Progress Notes (Unsigned)
I,Teresa Pitts,acting as a Education administrator for Teresa Sprout, FNP.,have documented all relevant documentation on the behalf of Teresa Sprout, FNP,as directed by  Teresa Sprout, FNP while in the presence of Teresa Sprout, FNP.  Complete physical exam  Patient: Teresa Pitts   DOB: 1961/12/16   62 y.o. Female  MRN: [id number] Visit Date: 02/24/2023  Today's healthcare provider: Gwyneth Sprout, FNP  Introduced to nurse practitioner role and practice setting.  All questions answered.  Discussed provider/patient relationship and expectations.  Subjective    Teresa Pitts is a 62 y.o. female who presents today for a complete physical exam.  She reports consuming a general diet. The patient has a physically strenuous job, but has no regular exercise apart from work.  She generally feels fairly well. She reports sleeping fairly well. She does have additional problems to discuss today.   HPI  Patient would like to discuss medications for anxiety and ADHD  Past Medical History:  Diagnosis Date   Allergy    Depression    Past Surgical History:  Procedure Laterality Date   ABDOMINAL HYSTERECTOMY  2001   COLONOSCOPY WITH PROPOFOL N/A 09/24/2017   Procedure: COLONOSCOPY WITH PROPOFOL;  Surgeon: Teresa Bellows, MD;  Location: [facility] ENDOSCOPY;  Service: Gastroenterology;  Laterality: N/A;   TONSILLECTOMY AND ADENOIDECTOMY  1980   Social History   Socioeconomic History   Marital status: Married    Spouse name: Not on file   Number of children: 2   Years of education: Not on file   Highest education level: Not on file  Occupational History   Not on file  Tobacco Use   Smoking status: Never   Smokeless tobacco: Never  Vaping Use   Vaping Use: Never used  Substance and Sexual Activity   Alcohol use: No   Drug use: No   Sexual activity: Yes    Birth control/protection: Surgical  Other Topics Concern   Not on file  Social History Narrative   Not on file   Social Determinants of Health    Financial Resource Strain: Not on file  Food Insecurity: Not on file  Transportation Needs: Not on file  Physical Activity: Not on file  Stress: Not on file  Social Connections: Not on file  Intimate Partner Violence: Not on file   Family Status  Relation Name Status   Mother  Alive   Father  Alive   Brother  Deceased       suicide   Daughter  Alive   Son  Alive   Family History  Problem Relation Age of Onset   Cancer Mother        uterus   Diabetes Father    Hypertension Father    No Known Allergies  Patient Care Team: Teresa Sprout, FNP as PCP - General (Family Medicine)   Medications: Outpatient Medications Prior to Visit  Medication Sig   [DISCONTINUED] venlafaxine XR (EFFEXOR-XR) 150 MG 24 hr capsule TAKE 1 CAPSULE BY MOUTH EVERY DAY WITH BREAKFAST   [DISCONTINUED] oseltamivir (TAMIFLU) 75 MG capsule Take 1 capsule (75 mg total) by mouth 2 (two) times daily. (Patient not taking: Reported on 02/24/2023)   No facility-administered medications prior to visit.   Review of Systems    Objective    BP (!) 140/84 (BP Location: Left Arm, Patient Position: Sitting, Cuff Size: Large)   Pulse 87   Temp 98.5 F (36.9 C) (Oral)   Ht '5\' 2"'$  (1.575 m)  Wt 245 lb 11.2 oz (111.4 kg)   SpO2 97%   BMI 44.94 kg/m    Physical Exam Vitals and nursing note reviewed.  Constitutional:      General: She is awake. She is not in acute distress.    Appearance: Normal appearance. She is well-developed and well-groomed. She is obese. She is not ill-appearing, toxic-appearing or diaphoretic.  HENT:     Head: Normocephalic and atraumatic.     Jaw: There is normal jaw occlusion. No trismus, tenderness, swelling or pain on movement.     Right Ear: Hearing, tympanic membrane, ear canal and external ear normal. There is no impacted cerumen.     Left Ear: Hearing, tympanic membrane, ear canal and external ear normal. There is no impacted cerumen.     Nose: Nose normal. No congestion  or rhinorrhea.     Right Turbinates: Not enlarged, swollen or pale.     Left Turbinates: Not enlarged, swollen or pale.     Right Sinus: No maxillary sinus tenderness or frontal sinus tenderness.     Left Sinus: No maxillary sinus tenderness or frontal sinus tenderness.     Mouth/Throat:     Lips: Pink.     Mouth: Mucous membranes are moist. No injury.     Tongue: No lesions.     Pharynx: Oropharynx is clear. Uvula midline. No pharyngeal swelling, oropharyngeal exudate, posterior oropharyngeal erythema or uvula swelling.     Tonsils: No tonsillar exudate or tonsillar abscesses.  Eyes:     General: Lids are normal. Lids are everted, no foreign bodies appreciated. Vision grossly intact. Gaze aligned appropriately. No allergic shiner or visual field deficit.       Right eye: No discharge.        Left eye: No discharge.     Extraocular Movements: Extraocular movements intact.     Conjunctiva/sclera: Conjunctivae normal.     Right eye: Right conjunctiva is not injected. No exudate.    Left eye: Left conjunctiva is not injected. No exudate.    Pupils: Pupils are equal, round, and reactive to light.  Neck:     Thyroid: No thyroid mass, thyromegaly or thyroid tenderness.     Vascular: No carotid bruit.     Trachea: Trachea normal.  Cardiovascular:     Rate and Rhythm: Normal rate and regular rhythm.     Pulses: Normal pulses.          Carotid pulses are 2+ on the right side and 2+ on the left side.      Radial pulses are 2+ on the right side and 2+ on the left side.       Dorsalis pedis pulses are 2+ on the right side and 2+ on the left side.       Posterior tibial pulses are 2+ on the right side and 2+ on the left side.     Heart sounds: Normal heart sounds, S1 normal and S2 normal. No murmur heard.    No friction rub. No gallop.  Pulmonary:     Effort: Pulmonary effort is normal. No respiratory distress.     Breath sounds: Normal breath sounds and air entry. No stridor. No wheezing,  rhonchi or rales.  Chest:     Chest wall: No tenderness.  Abdominal:     General: Abdomen is flat. Bowel sounds are normal. There is no distension.     Palpations: Abdomen is soft. There is no mass.     Tenderness: There is abdominal  tenderness. There is no right CVA tenderness, left CVA tenderness, guarding or rebound.     Hernia: No hernia is present.  Genitourinary:    Comments: Exam deferred; denies complaints Musculoskeletal:        General: No swelling, tenderness, deformity or signs of injury. Normal range of motion.     Cervical back: Full passive range of motion without pain, normal range of motion and neck supple. No edema, rigidity or tenderness. No muscular tenderness.     Right lower leg: No edema.     Left lower leg: No edema.  Lymphadenopathy:     Cervical: No cervical adenopathy.     Right cervical: No superficial, deep or posterior cervical adenopathy.    Left cervical: No superficial, deep or posterior cervical adenopathy.  Skin:    General: Skin is warm and dry.     Capillary Refill: Capillary refill takes less than 2 seconds.     Coloration: Skin is not jaundiced or pale.     Findings: No bruising, erythema, lesion or rash.  Neurological:     General: No focal deficit present.     Mental Status: She is alert and oriented to person, place, and time. Mental status is at baseline.     GCS: GCS eye subscore is 4. GCS verbal subscore is 5. GCS motor subscore is 6.     Sensory: Sensation is intact. No sensory deficit.     Motor: Motor function is intact. No weakness.     Coordination: Coordination is intact. Coordination normal.     Gait: Gait is intact. Gait normal.  Psychiatric:        Attention and Perception: Attention and perception normal.        Mood and Affect: Mood and affect normal.        Speech: Speech normal.        Behavior: Behavior normal. Behavior is cooperative.        Thought Content: Thought content normal.        Cognition and Memory: Cognition  and memory normal.        Judgment: Judgment normal.     Last depression screening scores    02/24/2023   10:45 AM 11/07/2022    2:04 PM 04/27/2020    9:20 AM  PHQ 2/9 Scores  PHQ - 2 Score 2 0 0  PHQ- 9 Score 16 7    Last fall risk screening    02/24/2023   10:44 AM  [place] in the past year? 0  Number falls in past yr: 0  Injury with Fall? 0   Last Audit-C alcohol use screening    11/07/2022    2:05 PM  Alcohol Use Disorder Test (AUDIT)  1. How often do you have a drink containing alcohol? 0  2. How many drinks containing alcohol do you have on a typical day when you are drinking? 0  3. How often do you have six or more drinks on one occasion? 0  AUDIT-C Score 0   A score of 3 or more in women, and 4 or more in men indicates increased risk for alcohol abuse, EXCEPT if all of the points are from question 1   No results found for any visits on 02/24/23.  Assessment & Plan    Routine Health Maintenance and Physical Exam  Exercise Activities and Dietary recommendations  Goals      Exercise 150 minutes per week (moderate activity)  Immunization History  Administered Date(s) Administered   Influenza Split 12/12/2010   Influenza,inj,Quad PF,6+ Mos 09/25/2014, 09/26/2015   Td 05/03/2018   Tdap 09/03/2007    Health Maintenance  Topic Date Due   COVID-19 Vaccine (1) Never done   MAMMOGRAM  Never done   Zoster Vaccines- Shingrix (1 of 2) Never done   PAP SMEAR-Modifier  04/28/2023   COLONOSCOPY (Pts 45-77yr Insurance coverage will need to be confirmed)  09/25/2027   DTaP/Tdap/Td (3 - Td or Tdap) 05/03/2028   INFLUENZA VACCINE  Completed   Hepatitis C Screening  Completed   HIV Screening  Completed   HPV VACCINES  Aged Out    Discussed health benefits of physical activity, and encouraged her to engage in regular exercise appropriate for her age and condition.  Problem List Items Addressed This Visit       Cardiovascular and Mediastinum    Primary hypertension    Chronic x 2 occasions; new diagnosis Recommend Losartan-HCTZ to assist at 50-12.5 with 1 month f/u Recommend home checks to reach goal of 130/80      Relevant Medications   losartan-hydrochlorothiazide (HYZAAR) 50-12.5 MG tablet   Other Relevant Orders   CBC with Differential/Platelet   Comprehensive Metabolic Panel (CMET)   Hemoglobin A1c     Digestive   Ulcerative colitis without complications (HCC)    Abdominal pain on exam; not on medications Due for colon cancer screening; referral back to GI/Dr AVicente MalesContinue to monitor for s/s of blood or mucus in stools       Relevant Orders   Ambulatory referral to Gastroenterology     Other   Adjustment disorder with anxious mood    Chronic, stable Continue effexor at same dose; continue to monitor s/s       Relevant Medications   venlafaxine XR (EFFEXOR-XR) 150 MG 24 hr capsule   Annual physical exam - Primary    UTD on dental; due for vision Due for mammo; Due for colon cancer screening Hx of hysterectomy; does not need PAP Things to do to keep yourself healthy  - Exercise at least 30-45 minutes a day, 3-4 days a week.  - Eat a low-fat diet with lots of fruits and vegetables, up to 7-9 servings per day.  - Seatbelts can save your life. Wear them always.  - Smoke detectors on every level of your home, check batteries every year.  - Eye Doctor - have an eye exam every 1-2 years  - Safe sex - if you may be exposed to STDs, use a condom.  - Alcohol -  If you drink, do it moderately, less than 2 drinks per day.  - HBude Choose someone to speak for you if you are not able.  - Depression is common in our stressful world.If you're feeling down or losing interest in things you normally enjoy, please come in for a visit.  - Violence - If anyone is threatening or hurting you, please call immediately.       Relevant Orders   MM 3D SCREEN BREAST BILATERAL   Comprehensive Metabolic Panel  (CMET)   Lipid panel   TSH + free T4   Hemoglobin A1c   Attention deficit hyperactivity disorder (ADHD), combined type    Chronic, variable Trial of medication to assist Previous family hx      Relevant Medications   methylphenidate (RITALIN LA) 10 MG 24 hr capsule   Avitaminosis D    Chronic, previously low Recommend  labs given concern for association with mood disorder, and obesity       Relevant Orders   Vitamin D (25 hydroxy)   Colon cancer screening   Relevant Orders   Ambulatory referral to Gastroenterology   Hypercholesterolemia    Chronic, previously elevated Not on statin at this time Repeat LP Goal LDL <100 with pre-dm recommend diet low in saturated fat and regular exercise - 30 min at least 5 times per week       Relevant Medications   losartan-hydrochlorothiazide (HYZAAR) 50-12.5 MG tablet   Other Relevant Orders   Lipid panel   Pre-diabetes    Chronic, previously stable with diet/exercise Repeat A1c Continue to recommend balanced, lower carb meals. Smaller meal size, adding snacks. Choosing water as drink of choice and increasing purposeful exercise.       Relevant Orders   TSH + free T4   Screening mammogram for breast cancer    Due for screening for mammogram, denies breast concerns, provided with phone number to call and schedule appointment for mammogram. Encouraged to repeat breast cancer screening every 1-2 years.       Relevant Orders   MM 3D SCREEN BREAST BILATERAL   Return in about 4 weeks (around 03/24/2023) for chonic disease management.    Vonna Kotyk, FNP, have reviewed all documentation for this visit. The documentation on 02/24/23 for the exam, diagnosis, procedures, and orders are all accurate and complete.  Teresa Pitts, [place] [phone] (phone) [phone] (fax)  [place]

## 2023-02-24 ENCOUNTER — Ambulatory Visit (INDEPENDENT_AMBULATORY_CARE_PROVIDER_SITE_OTHER): Payer: BC Managed Care – PPO | Admitting: Family Medicine

## 2023-02-24 ENCOUNTER — Encounter: Payer: Self-pay | Admitting: Family Medicine

## 2023-02-24 VITALS — BP 140/84 | HR 87 | Temp 98.5°F | Ht 62.0 in | Wt 245.7 lb

## 2023-02-24 DIAGNOSIS — E559 Vitamin D deficiency, unspecified: Secondary | ICD-10-CM

## 2023-02-24 DIAGNOSIS — I10 Essential (primary) hypertension: Secondary | ICD-10-CM

## 2023-02-24 DIAGNOSIS — E7800 Pure hypercholesterolemia, unspecified: Secondary | ICD-10-CM

## 2023-02-24 DIAGNOSIS — F902 Attention-deficit hyperactivity disorder, combined type: Secondary | ICD-10-CM | POA: Insufficient documentation

## 2023-02-24 DIAGNOSIS — Z0000 Encounter for general adult medical examination without abnormal findings: Secondary | ICD-10-CM | POA: Diagnosis not present

## 2023-02-24 DIAGNOSIS — R7303 Prediabetes: Secondary | ICD-10-CM

## 2023-02-24 DIAGNOSIS — F4322 Adjustment disorder with anxiety: Secondary | ICD-10-CM

## 2023-02-24 DIAGNOSIS — Z1231 Encounter for screening mammogram for malignant neoplasm of breast: Secondary | ICD-10-CM

## 2023-02-24 DIAGNOSIS — K5190 Ulcerative colitis, unspecified, without complications: Secondary | ICD-10-CM

## 2023-02-24 DIAGNOSIS — Z1211 Encounter for screening for malignant neoplasm of colon: Secondary | ICD-10-CM

## 2023-02-24 MED ORDER — LOSARTAN POTASSIUM-HCTZ 50-12.5 MG PO TABS: 1.0000 | ORAL_TABLET | Freq: Every day | ORAL | 1 refills | Status: DC

## 2023-02-24 MED ORDER — VENLAFAXINE HCL ER 150 MG PO CP24: ORAL_CAPSULE | ORAL | 3 refills | Status: DC

## 2023-02-24 MED ORDER — METHYLPHENIDATE HCL ER (LA) 10 MG PO CP24: 10.0000 mg | ORAL_CAPSULE | Freq: Every day | ORAL | 0 refills | Status: DC

## 2023-02-24 NOTE — Assessment & Plan Note (Signed)
Chronic, previously elevated Not on statin at this time Repeat LP Goal LDL <100 with pre-dm recommend diet low in saturated fat and regular exercise - 30 min at least 5 times per week

## 2023-02-24 NOTE — Assessment & Plan Note (Signed)
Chronic x 2 occasions; new diagnosis Recommend Losartan-HCTZ to assist at 50-12.5 with 1 month f/u Recommend home checks to reach goal of 130/80

## 2023-02-24 NOTE — Assessment & Plan Note (Signed)
Chronic, previously low Recommend labs given concern for association with mood disorder, and obesity

## 2023-02-24 NOTE — Assessment & Plan Note (Signed)
Chronic, stable Continue effexor at same dose; continue to monitor s/s

## 2023-02-24 NOTE — Patient Instructions (Signed)
Please call and schedule your mammogram:  [facility]  [place], [place],  [place]  [phone] Get Driving Directions Main: [phone]  Sunday:Closed Monday:7:20 AM - 5:00 PM Tuesday:7:20 AM - 5:00 PM Wednesday:7:20 AM - 5:00 PM Thursday:7:20 AM - 5:00 PM Friday:7:20 AM - 4:30 PM Saturday:Closed

## 2023-02-24 NOTE — Assessment & Plan Note (Signed)
Chronic, variable Trial of medication to assist Previous family hx

## 2023-02-24 NOTE — Assessment & Plan Note (Signed)
Due for screening for mammogram, denies breast concerns, provided with phone number to call and schedule appointment for mammogram. Encouraged to repeat breast cancer screening every 1-2 years.  

## 2023-02-24 NOTE — Assessment & Plan Note (Signed)
Abdominal pain on exam; not on medications Due for colon cancer screening; referral back to GI/Dr Vicente Males Continue to monitor for s/s of blood or mucus in stools

## 2023-02-24 NOTE — Assessment & Plan Note (Signed)
Chronic, previously stable with diet/exercise Repeat A1c Continue to recommend balanced, lower carb meals. Smaller meal size, adding snacks. Choosing water as drink of choice and increasing purposeful exercise.

## 2023-02-24 NOTE — Assessment & Plan Note (Signed)
UTD on dental; due for vision Due for mammo; Due for colon cancer screening Hx of hysterectomy; does not need PAP Things to do to keep yourself healthy  - Exercise at least 30-45 minutes a day, 3-4 days a week.  - Eat a low-fat diet with lots of fruits and vegetables, up to 7-9 servings per day.  - Seatbelts can save your life. Wear them always.  - Smoke detectors on every level of your home, check batteries every year.  - Eye Doctor - have an eye exam every 1-2 years  - Safe sex - if you may be exposed to STDs, use a condom.  - Alcohol -  If you drink, do it moderately, less than 2 drinks per day.  - [place]. Choose someone to speak for you if you are not able.  - Depression is common in our stressful world.If you're feeling down or losing interest in things you normally enjoy, please come in for a visit.  - Violence - If anyone is threatening or hurting you, please call immediately.

## 2023-02-25 LAB — CBC WITH DIFFERENTIAL/PLATELET
Basophils Absolute: 0 10*3/uL (ref 0.0–0.2)
Basos: 1 %
EOS (ABSOLUTE): 0.1 10*3/uL (ref 0.0–0.4)
Eos: 2 %
Hematocrit: 37.7 % (ref 34.0–46.6)
Hemoglobin: 11.5 g/dL (ref 11.1–15.9)
Immature Grans (Abs): 0 10*3/uL (ref 0.0–0.1)
Immature Granulocytes: 1 %
Lymphocytes Absolute: 1.2 10*3/uL (ref 0.7–3.1)
Lymphs: 32 %
MCH: 23.5 pg — ABNORMAL LOW (ref 26.6–33.0)
MCHC: 30.5 g/dL — ABNORMAL LOW (ref 31.5–35.7)
MCV: 77 fL — ABNORMAL LOW (ref 79–97)
Monocytes Absolute: 0.4 10*3/uL (ref 0.1–0.9)
Monocytes: 10 %
Neutrophils Absolute: 2.2 10*3/uL (ref 1.4–7.0)
Neutrophils: 54 %
Platelets: 278 10*3/uL (ref 150–450)
RBC: 4.89 x10E6/uL (ref 3.77–5.28)
RDW: 13.6 % (ref 11.7–15.4)
WBC: 3.9 10*3/uL (ref 3.4–10.8)

## 2023-02-25 LAB — LIPID PANEL
Chol/HDL Ratio: 2.8 ratio (ref 0.0–4.4)
Cholesterol, Total: 251 mg/dL — ABNORMAL HIGH (ref 100–199)
HDL: 89 mg/dL (ref >39)
LDL Chol Calc (NIH): 147 mg/dL — ABNORMAL HIGH (ref 0–99)
Triglycerides: 90 mg/dL (ref 0–149)
VLDL Cholesterol Cal: 15 mg/dL (ref 5–40)

## 2023-02-25 LAB — COMPREHENSIVE METABOLIC PANEL
ALT: 10 IU/L (ref 0–32)
AST: 12 IU/L (ref 0–40)
Albumin/Globulin Ratio: 1.5 (ref 1.2–2.2)
Albumin: 4 g/dL (ref 3.8–4.9)
Alkaline Phosphatase: 65 IU/L (ref 44–121)
BUN/Creatinine Ratio: 14 (ref 12–28)
BUN: 12 mg/dL (ref 8–27)
Bilirubin Total: <0.2 mg/dL (ref 0.0–1.2)
CO2: 22 mmol/L (ref 20–29)
Calcium: 9.2 mg/dL (ref 8.7–10.3)
Chloride: 101 mmol/L (ref 96–106)
Creatinine, Ser: 0.87 mg/dL (ref 0.57–1.00)
Globulin, Total: 2.6 g/dL (ref 1.5–4.5)
Glucose: 140 mg/dL — ABNORMAL HIGH (ref 70–99)
Potassium: 4.3 mmol/L (ref 3.5–5.2)
Sodium: 139 mmol/L (ref 134–144)
Total Protein: 6.6 g/dL (ref 6.0–8.5)
eGFR: 76 mL/min/{1.73_m2} (ref >59)

## 2023-02-25 LAB — VITAMIN D 25 HYDROXY (VIT D DEFICIENCY, FRACTURES): Vit D, 25-Hydroxy: 18.2 ng/mL — ABNORMAL LOW (ref 30.0–100.0)

## 2023-02-25 LAB — TSH+FREE T4
Free T4: 1.03 ng/dL (ref 0.82–1.77)
TSH: 3.27 u[IU]/mL (ref 0.450–4.500)

## 2023-02-25 LAB — HEMOGLOBIN A1C
Est. average glucose Bld gHb Est-mCnc: 128 mg/dL
Hgb A1c MFr Bld: 6.1 % — ABNORMAL HIGH (ref 4.8–5.6)

## 2023-02-27 ENCOUNTER — Other Ambulatory Visit: Payer: Self-pay | Admitting: Family Medicine

## 2023-02-27 DIAGNOSIS — E7800 Pure hypercholesterolemia, unspecified: Secondary | ICD-10-CM

## 2023-02-27 DIAGNOSIS — E559 Vitamin D deficiency, unspecified: Secondary | ICD-10-CM

## 2023-02-27 DIAGNOSIS — D509 Iron deficiency anemia, unspecified: Secondary | ICD-10-CM | POA: Insufficient documentation

## 2023-02-27 DIAGNOSIS — R7303 Prediabetes: Secondary | ICD-10-CM

## 2023-02-27 DIAGNOSIS — I2510 Atherosclerotic heart disease of native coronary artery without angina pectoris: Secondary | ICD-10-CM | POA: Insufficient documentation

## 2023-02-27 MED ORDER — ROSUVASTATIN CALCIUM 10 MG PO TABS: 10.0000 mg | ORAL_TABLET | Freq: Every day | ORAL | 11 refills | Status: DC

## 2023-02-27 MED ORDER — VITAMIN D (ERGOCALCIFEROL) 1.25 MG (50000 UNIT) PO CAPS: 50000.0000 [IU] | ORAL_CAPSULE | ORAL | 0 refills | Status: AC

## 2023-02-27 MED ORDER — METFORMIN HCL ER 750 MG PO TB24: 750.0000 mg | ORAL_TABLET | Freq: Every day | ORAL | 11 refills | Status: AC

## 2023-02-27 NOTE — Progress Notes (Signed)
Recommend f/u with hematology given concern for worsening iron levels and small cell volume, with low concentration of hemoglobin on red cells.  Cholesterol remains elevated. Stroke/MI risk is moderate at 5% in 10 years. I continue to recommend diet low in saturated fat and regular exercise - 30 min at least 5 times per week  The 10-year ASCVD risk score (Arnett DK, et al., 2019) is: 4.6%   Values used to calculate the score:     Age: 62 years     Sex: Female     Is Non-Hispanic African American: No     Diabetic: No     Tobacco smoker: No     Systolic Blood Pressure: [id number] mmHg     Is BP treated: Yes     HDL Cholesterol: 89 mg/dL     Total Cholesterol: 251 mg/dL  Stable pre-diabetes; goal to prevent transition to T2DM. Continue to recommend balanced, lower carb meals. Smaller meal size, adding snacks. Choosing water as drink of choice and increasing purposeful exercise. If desired, can start once daily XR Metformin at 750 mg.  Vit D remains low; recommend 5000 IU OTC daily.   Normal thyroid.  Please make mammogram appt and let us know if you do not hear from GI.  Gwyneth Sprout, [address] [place], [place] [address] [phone] (phone) [phone] (fax) [place]

## 2023-03-06 ENCOUNTER — Telehealth: Payer: Self-pay | Admitting: *Deleted

## 2023-03-06 NOTE — Telephone Encounter (Signed)
Nurse placed call to patient to review appointment details for upcoming new hematology consultation visit. Patient did not answer, could not leave vm.

## 2023-03-09 ENCOUNTER — Encounter: Payer: Self-pay | Admitting: Oncology

## 2023-03-09 ENCOUNTER — Inpatient Hospital Stay: Payer: BC Managed Care – PPO | Attending: Oncology | Admitting: Oncology

## 2023-03-09 ENCOUNTER — Inpatient Hospital Stay: Payer: BC Managed Care – PPO

## 2023-03-09 VITALS — BP 132/69 | HR 74 | Temp 98.0°F | Resp 18 | Ht 62.0 in | Wt 244.1 lb

## 2023-03-09 DIAGNOSIS — K5190 Ulcerative colitis, unspecified, without complications: Secondary | ICD-10-CM

## 2023-03-09 DIAGNOSIS — Z8249 Family history of ischemic heart disease and other diseases of the circulatory system: Secondary | ICD-10-CM

## 2023-03-09 DIAGNOSIS — Z8049 Family history of malignant neoplasm of other genital organs: Secondary | ICD-10-CM | POA: Diagnosis not present

## 2023-03-09 DIAGNOSIS — D509 Iron deficiency anemia, unspecified: Secondary | ICD-10-CM | POA: Diagnosis not present

## 2023-03-09 DIAGNOSIS — Z79899 Other long term (current) drug therapy: Secondary | ICD-10-CM | POA: Diagnosis not present

## 2023-03-09 DIAGNOSIS — R531 Weakness: Secondary | ICD-10-CM | POA: Diagnosis not present

## 2023-03-09 DIAGNOSIS — Z833 Family history of diabetes mellitus: Secondary | ICD-10-CM

## 2023-03-09 DIAGNOSIS — R5383 Other fatigue: Secondary | ICD-10-CM

## 2023-03-09 DIAGNOSIS — Z90710 Acquired absence of both cervix and uterus: Secondary | ICD-10-CM

## 2023-03-09 LAB — FOLATE: Folate: 14.1 ng/mL (ref >5.9)

## 2023-03-09 LAB — VITAMIN B12: Vitamin B-12: 272 pg/mL (ref 180–914)

## 2023-03-09 LAB — CBC (CANCER CENTER ONLY)
HCT: 38.9 % (ref 36.0–46.0)
Hemoglobin: 11.9 g/dL — ABNORMAL LOW (ref 12.0–15.0)
MCH: 24.1 pg — ABNORMAL LOW (ref 26.0–34.0)
MCHC: 30.6 g/dL (ref 30.0–36.0)
MCV: 78.9 fL — ABNORMAL LOW (ref 80.0–100.0)
Platelet Count: 255 10*3/uL (ref 150–400)
RBC: 4.93 MIL/uL (ref 3.87–5.11)
RDW: 14.6 % (ref 11.5–15.5)
WBC Count: 4.9 10*3/uL (ref 4.0–10.5)
nRBC: 0 % (ref 0.0–0.2)

## 2023-03-09 LAB — IRON AND TIBC
Iron: 36 ug/dL (ref 28–170)
Saturation Ratios: 8 % — ABNORMAL LOW (ref 10.4–31.8)
TIBC: 455 ug/dL — ABNORMAL HIGH (ref 250–450)
UIBC: 419 ug/dL

## 2023-03-09 LAB — FERRITIN: Ferritin: 5 ng/mL — ABNORMAL LOW (ref 11–307)

## 2023-03-09 NOTE — Progress Notes (Signed)
Patient states that she has low energy.

## 2023-03-09 NOTE — Progress Notes (Signed)
[facility]  [phone] [phone]  ID: Phillis Knack OB: 1961-01-11  MR#: [id number]  [id number]  Patient Care Team: Gwyneth Sprout, FNP as PCP - General (Family Medicine)  CHIEF COMPLAINT: Microcytic anemia.  INTERVAL HISTORY: Patient is a 62 year old female with ulcerative colitis who was noted to have microcytosis with a low normal hemoglobin on routine blood work.  She admits to chronic weakness and fatigue, but otherwise feels well.  She has no neurologic complaints.  She denies any recent fevers or illnesses.  She has a good appetite and denies weight loss.  She has no chest pain, shortness of breath, cough, or hemoptysis.  She denies any nausea, vomiting, constipation, or diarrhea.  She has had no recent melena or hematochezia.  She has no urinary complaints.  Patient offers no further specific complaints today.  REVIEW OF SYSTEMS:   Review of Systems  Constitutional:  Positive for malaise/fatigue. Negative for fever and weight loss.  Respiratory: Negative.  Negative for cough, hemoptysis and shortness of breath.   Cardiovascular: Negative.  Negative for chest pain and leg swelling.  Gastrointestinal: Negative.  Negative for abdominal pain, blood in stool and melena.  Genitourinary: Negative.  Negative for dysuria.  Musculoskeletal: Negative.  Negative for back pain.  Skin: Negative.  Negative for rash.  Neurological:  Positive for weakness. Negative for dizziness, focal weakness and headaches.  Psychiatric/Behavioral: Negative.  The patient is not nervous/anxious.     As per HPI. Otherwise, a complete review of systems is negative.  PAST MEDICAL HISTORY: Past Medical History:  Diagnosis Date   Allergy    Depression     PAST SURGICAL HISTORY: Past Surgical History:  Procedure Laterality Date   ABDOMINAL HYSTERECTOMY  2001   COLONOSCOPY WITH PROPOFOL N/A 09/24/2017   Procedure: COLONOSCOPY WITH PROPOFOL;  Surgeon: Jonathon Bellows,  MD;  Location: [facility] ENDOSCOPY;  Service: Gastroenterology;  Laterality: N/A;   TONSILLECTOMY AND ADENOIDECTOMY  1980    FAMILY HISTORY: Family History  Problem Relation Age of Onset   Cancer Mother        uterus   Diabetes Father    Hypertension Father     ADVANCED DIRECTIVES (Y/N):  N  HEALTH MAINTENANCE: Social History   Tobacco Use   Smoking status: Never   Smokeless tobacco: Never  Vaping Use   Vaping Use: Never used  Substance Use Topics   Alcohol use: No   Drug use: No     Colonoscopy:  PAP:  Bone density:  Lipid panel:  No Known Allergies  Current Outpatient Medications  Medication Sig Dispense Refill   losartan (COZAAR) 50 MG tablet Take 50 mg by mouth daily.     losartan-hydrochlorothiazide (HYZAAR) 50-12.5 MG tablet Take 1 tablet by mouth daily. 30 tablet 1   rosuvastatin (CRESTOR) 10 MG tablet Take 1 tablet (10 mg total) by mouth daily. 30 tablet 11   venlafaxine XR (EFFEXOR-XR) 150 MG 24 hr capsule TAKE 1 CAPSULE BY MOUTH EVERY DAY WITH BREAKFAST 90 capsule 3   Vitamin D, Ergocalciferol, (DRISDOL) 1.25 MG (50000 UNIT) CAPS capsule Take 1 capsule (50,000 Units total) by mouth every 7 (seven) days. 26 capsule 0   metFORMIN (GLUCOPHAGE-XR) 750 MG 24 hr tablet Take 1 tablet (750 mg total) by mouth daily with breakfast. (Patient not taking: Reported on 03/09/2023) 30 tablet 11   methylphenidate (RITALIN LA) 10 MG 24 hr capsule Take 1 capsule (10 mg total) by mouth daily. (Patient not taking: Reported on 03/09/2023) 30  capsule 0   No current facility-administered medications for this visit.    OBJECTIVE: Vitals:   03/09/23 1128 03/09/23 1136  BP: (!) 141/69 132/69  Pulse: 73 74  Resp: 18   Temp: 98 F (36.7 C)   SpO2: 97%      Body mass index is 44.65 kg/m.    ECOG FS:0 - Asymptomatic  General: Well-developed, well-nourished, no acute distress. Eyes: Pink conjunctiva, anicteric sclera. HEENT: Normocephalic, moist mucous membranes. Lungs: No audible  wheezing or coughing. Heart: Regular rate and rhythm. Abdomen: Soft, nontender, no obvious distention. Musculoskeletal: No edema, cyanosis, or clubbing. Neuro: Alert, answering all questions appropriately. Cranial nerves grossly intact. Skin: No rashes or petechiae noted. Psych: Normal affect. Lymphatics: No cervical, calvicular, axillary or inguinal LAD.   LAB RESULTS:  Lab Results  Component Value Date   NA 139 02/24/2023   K 4.3 02/24/2023   CL 101 02/24/2023   CO2 22 02/24/2023   GLUCOSE 140 (H) 02/24/2023   BUN 12 02/24/2023   CREATININE 0.87 02/24/2023   CALCIUM 9.2 02/24/2023   PROT 6.6 02/24/2023   ALBUMIN 4.0 02/24/2023   AST 12 02/24/2023   ALT 10 02/24/2023   ALKPHOS 65 02/24/2023   BILITOT <0.2 02/24/2023   GFRNONAA 73 04/27/2020   GFRAA 84 04/27/2020    Lab Results  Component Value Date   WBC 4.9 03/09/2023   NEUTROABS 2.2 02/24/2023   HGB 11.9 (L) 03/09/2023   HCT 38.9 03/09/2023   MCV 78.9 (L) 03/09/2023   PLT 255 03/09/2023     STUDIES: No results found.  ASSESSMENT: Microcytic anemia.  PLAN:    Microcytic anemia: Patient noted to have a mildly decreased hemoglobin with a microcytosis.  Iron stores are pending at time of dictation, but given her active ulcerative colitis, she likely has iron deficiency.  B12 and folate were drawn for completeness and are pending at time of dictation.  Patient will return to clinic in 1 week to receive 200 mg IV Venofer.  Additional infusions will be added if needed.  She would then return to clinic in 4 months with repeat laboratory work, further evaluation, and additional treatment if necessary. Ulcerative colitis: Continue follow-up with GI as recommended.   I spent a total of 45 minutes reviewing chart data, face-to-face evaluation with the patient, counseling and coordination of care as detailed above.   Patient expressed understanding and was in agreement with this plan. She also understands that She can  call clinic at any time with any questions, concerns, or complaints.    Lloyd Huger, MD   03/09/2023 2:14 PM

## 2023-03-18 ENCOUNTER — Inpatient Hospital Stay: Payer: BC Managed Care – PPO

## 2023-03-20 ENCOUNTER — Inpatient Hospital Stay: Payer: BC Managed Care – PPO

## 2023-03-23 ENCOUNTER — Inpatient Hospital Stay: Payer: BC Managed Care – PPO

## 2023-03-23 ENCOUNTER — Encounter: Payer: Self-pay | Admitting: Family Medicine

## 2023-03-23 ENCOUNTER — Ambulatory Visit (INDEPENDENT_AMBULATORY_CARE_PROVIDER_SITE_OTHER): Payer: BC Managed Care – PPO | Admitting: Family Medicine

## 2023-03-23 VITALS — BP 130/63 | HR 72 | Temp 98.1°F | Resp 12 | Ht 62.0 in | Wt 244.4 lb

## 2023-03-23 VITALS — BP 102/60 | HR 70 | Temp 99.3°F | Resp 18

## 2023-03-23 DIAGNOSIS — I10 Essential (primary) hypertension: Secondary | ICD-10-CM

## 2023-03-23 DIAGNOSIS — E559 Vitamin D deficiency, unspecified: Secondary | ICD-10-CM

## 2023-03-23 DIAGNOSIS — R7303 Prediabetes: Secondary | ICD-10-CM

## 2023-03-23 DIAGNOSIS — Z6841 Body Mass Index (BMI) 40.0 and over, adult: Secondary | ICD-10-CM

## 2023-03-23 DIAGNOSIS — F4322 Adjustment disorder with anxiety: Secondary | ICD-10-CM | POA: Diagnosis not present

## 2023-03-23 DIAGNOSIS — D509 Iron deficiency anemia, unspecified: Secondary | ICD-10-CM

## 2023-03-23 MED ORDER — SODIUM CHLORIDE 0.9 % IV SOLN
Freq: Once | INTRAVENOUS | Status: AC
  Filled 2023-03-23: qty 250

## 2023-03-23 MED ORDER — SODIUM CHLORIDE 0.9 % IV SOLN
200.0000 mg | Freq: Once | INTRAVENOUS | Status: AC
  Administered 2023-03-23: 200 mg via INTRAVENOUS
  Filled 2023-03-23: qty 200

## 2023-03-23 MED ORDER — TIRZEPATIDE-WEIGHT MANAGEMENT 2.5 MG/0.5ML ~~LOC~~ SOAJ: 2.5000 mg | SUBCUTANEOUS | 0 refills | Status: DC

## 2023-03-23 NOTE — Assessment & Plan Note (Signed)
Chronic, stable Continue to recommend balanced, lower carb meals. Smaller meal size, adding snacks. Choosing water as drink of choice and increasing purposeful exercise. Goal to prevent progression to T2DM Discussed BP goal will need to change if pre-diabetes does progress to T2DM

## 2023-03-23 NOTE — Assessment & Plan Note (Signed)
Chronic, improved Recheck BMP given cramps intermittent in L leg from knee to ankle; no known trauma BP at goal of <140/<90 with known pre-diabetes Continue hyzaar 50-12.5 Continue lifestyle mgmt as well- low fat diet, exercise as able

## 2023-03-23 NOTE — Assessment & Plan Note (Signed)
Chronic, untreated at this time Patient unable to start medication and believes she has 'enough on her plate'

## 2023-03-23 NOTE — Assessment & Plan Note (Signed)
Chronic, stable Pt request to start WL medication to assist Will send in for start of zepbound given extensive obesity Body mass index is 44.7 kg/m. Known pre-diabetes, htn, ascvd risk as well as others Advised a PA is needed If not approved; can either try a trial of contrave or phentermine

## 2023-03-23 NOTE — Progress Notes (Signed)
I,J'ya E Hunter,acting as a scribe for Gwyneth Sprout, FNP.,have documented all relevant documentation on the behalf of Gwyneth Sprout, FNP,as directed by  Gwyneth Sprout, FNP while in the presence of Gwyneth Sprout, FNP.  Established patient visit  Patient: Teresa Pitts   DOB: 1961/09/06   62 y.o. Female  MRN: [id number] Visit Date: 03/23/2023  Today's healthcare provider: Gwyneth Sprout, FNP  Re Introduced to nurse practitioner role and practice setting.  All questions answered.  Discussed provider/patient relationship and expectations.  Subjective    HPI  Patient wants to discuss the possibility of starting Ozempic or another medication for weight loss, refers to food as an "addiction".   Reports stabbing/throbbing pain in left leg from ankle to knee.    Hypertension, follow-up  BP Readings from Last 3 Encounters:  03/23/23 130/63  03/09/23 132/69  02/24/23 (!) 140/84   Wt Readings from Last 3 Encounters:  03/23/23 244 lb 6.4 oz (110.9 kg)  03/09/23 244 lb 1.6 oz (110.7 kg)  02/24/23 245 lb 11.2 oz (111.4 kg)     She was last seen for hypertension 1 months ago.  BP at that visit was 140/84. Management since that visit includes Losartan-HCTZ to assist at 50-12.5.  She reports good compliance with treatment. She is having side effects.  She is following a Low fat diet. She is not exercising. She does not smoke.  Outside blood pressures are not being recorded. Symptoms: No chest pain No chest pressure  No palpitations No syncope  No dyspnea No orthopnea  No paroxysmal nocturnal dyspnea Yes lower extremity edema   Adjustment disorder with anxious mood, Follow-up  She was last seen for anxiety 1 months ago. Changes made at last visit include venlafaxine XR (EFFEXOR-XR) 150 MG 24 hr capsule.   She reports excellent compliance with treatment. She reports excellent tolerance of treatment. She is not having side effects.   She feels her anxiety is mild and Improved since  last visit.  Symptoms: No chest pain No difficulty concentrating  No dizziness No fatigue  No feelings of losing control No insomnia  No irritable No palpitations  No panic attacks No racing thoughts  Yes shortness of breath No sweating  No tremors/shakes    GAD-7 Results     No data to display          PHQ-9 Scores    03/23/2023    8:16 AM 02/24/2023   10:45 AM 11/07/2022    2:04 PM  PHQ9 SCORE ONLY  PHQ-9 Total Score 5 16 7     ---------------------------------------------------------------------------------------------------  Pertinent labs Lab Results  Component Value Date   CHOL 251 (H) 02/24/2023   HDL 89 02/24/2023   LDLCALC 147 (H) 02/24/2023   TRIG 90 02/24/2023   CHOLHDL 2.8 02/24/2023   Lab Results  Component Value Date   NA 139 02/24/2023   K 4.3 02/24/2023   CREATININE 0.87 02/24/2023   EGFR 76 02/24/2023   GLUCOSE 140 (H) 02/24/2023   TSH 3.270 02/24/2023     The 10-year ASCVD risk score (Arnett DK, et al., 2019) is: 3.9%  ---------------------------------------------------------------------------------------------------   Medications: Outpatient Medications Prior to Visit  Medication Sig   losartan-hydrochlorothiazide (HYZAAR) 50-12.5 MG tablet Take 1 tablet by mouth daily.   metFORMIN (GLUCOPHAGE-XR) 750 MG 24 hr tablet Take 1 tablet (750 mg total) by mouth daily with breakfast.   rosuvastatin (CRESTOR) 10 MG tablet Take 1 tablet (10 mg total) by mouth daily.  venlafaxine XR (EFFEXOR-XR) 150 MG 24 hr capsule TAKE 1 CAPSULE BY MOUTH EVERY DAY WITH BREAKFAST   Vitamin D, Ergocalciferol, (DRISDOL) 1.25 MG (50000 UNIT) CAPS capsule Take 1 capsule (50,000 Units total) by mouth every 7 (seven) days.   [DISCONTINUED] losartan (COZAAR) 50 MG tablet Take 50 mg by mouth daily.   methylphenidate (RITALIN LA) 10 MG 24 hr capsule Take 1 capsule (10 mg total) by mouth daily. (Patient not taking: Reported on 03/09/2023)   No facility-administered  medications prior to visit.    Review of Systems  Constitutional:  Positive for appetite change.  Musculoskeletal:        Left Stabbing pain in leg   Neurological:  Negative for dizziness, seizures, numbness and headaches.       Objective    BP 130/63 (BP Location: Right Arm, Patient Position: Sitting, Cuff Size: Normal)   Pulse 72   Temp 98.1 F (36.7 C) (Oral)   Resp 12   Ht 5\' 2"  (1.575 m)   Wt 244 lb 6.4 oz (110.9 kg)   SpO2 98%   BMI 44.70 kg/m    Physical Exam Vitals and nursing note reviewed.  Constitutional:      General: She is not in acute distress.    Appearance: Normal appearance. She is obese. She is not ill-appearing, toxic-appearing or diaphoretic.  HENT:     Head: Normocephalic and atraumatic.  Cardiovascular:     Rate and Rhythm: Normal rate and regular rhythm.     Pulses: Normal pulses.     Heart sounds: Normal heart sounds. No murmur heard.    No friction rub. No gallop.  Pulmonary:     Effort: Pulmonary effort is normal. No respiratory distress.     Breath sounds: Normal breath sounds. No stridor. No wheezing, rhonchi or rales.  Chest:     Chest wall: No tenderness.  Musculoskeletal:        General: No swelling, tenderness, deformity or signs of injury. Normal range of motion.     Right lower leg: Edema present.     Left lower leg: Edema present.     Comments: Non pitting edema to BLE; encourage use of TED or similar compression hose at 20 mm Hg  Skin:    General: Skin is warm and dry.     Capillary Refill: Capillary refill takes less than 2 seconds.     Coloration: Skin is not jaundiced or pale.     Findings: No bruising, erythema, lesion or rash.  Neurological:     General: No focal deficit present.     Mental Status: She is alert and oriented to person, place, and time. Mental status is at baseline.     Cranial Nerves: No cranial nerve deficit.     Sensory: No sensory deficit.     Motor: No weakness.     Coordination: Coordination  normal.  Psychiatric:        Mood and Affect: Mood normal.        Behavior: Behavior normal.        Thought Content: Thought content normal.        Judgment: Judgment normal.     No results found for any visits on 03/23/23.  Assessment & Plan     Problem List Items Addressed This Visit       Cardiovascular and Mediastinum   Primary hypertension - Primary    Chronic, improved Recheck BMP given cramps intermittent in L leg from knee to ankle; no known  trauma BP at goal of <140/<90 with known pre-diabetes Continue hyzaar 50-12.5 Continue lifestyle mgmt as well- low fat diet, exercise as able       Relevant Orders   Basic Metabolic Panel (BMET)     Other   BMI 40.0-44.9, adult (HCC) (Chronic)    Chronic, stable Pt request to start WL medication to assist Will send in for start of zepbound given extensive obesity Body mass index is 44.7 kg/m. Known pre-diabetes, htn, ascvd risk as well as others Advised a PA is needed If not approved; can either try a trial of contrave or phentermine       Relevant Medications   tirzepatide (ZEPBOUND) 2.5 MG/0.5ML Pen   Adjustment disorder with anxious mood    Chronic, improved 6 week f/u PHQ 9 reviewed Continue same dose of effexor at this time 150 mg QD       Avitaminosis D    Chronic, untreated at this time Patient unable to start medication and believes she has 'enough on her plate'      Pre-diabetes    Chronic, stable Continue to recommend balanced, lower carb meals. Smaller meal size, adding snacks. Choosing water as drink of choice and increasing purposeful exercise. Goal to prevent progression to T2DM Discussed BP goal will need to change if pre-diabetes does progress to T2DM      Return in about 2 months (around 05/23/2023) for chonic disease management.     Vonna Kotyk, FNP, have reviewed all documentation for this visit. The documentation on 03/23/23 for the exam, diagnosis, procedures, and orders are all  accurate and complete.  Gwyneth Sprout, [place] [phone] (phone) [phone] (fax)  [place]

## 2023-03-23 NOTE — Assessment & Plan Note (Signed)
Chronic, improved 6 week f/u PHQ 9 reviewed Continue same dose of effexor at this time 150 mg QD

## 2023-03-24 LAB — BASIC METABOLIC PANEL
BUN/Creatinine Ratio: 14 (ref 12–28)
BUN: 11 mg/dL (ref 8–27)
CO2: 25 mmol/L (ref 20–29)
Calcium: 8.9 mg/dL (ref 8.7–10.3)
Chloride: 103 mmol/L (ref 96–106)
Creatinine, Ser: 0.78 mg/dL (ref 0.57–1.00)
Glucose: 104 mg/dL — ABNORMAL HIGH (ref 70–99)
Potassium: 4.1 mmol/L (ref 3.5–5.2)
Sodium: 143 mmol/L (ref 134–144)
eGFR: 87 mL/min/{1.73_m2} (ref >59)

## 2023-03-24 NOTE — Progress Notes (Signed)
Labs are stabilized.

## 2023-03-25 ENCOUNTER — Inpatient Hospital Stay: Payer: BC Managed Care – PPO

## 2023-03-26 MED FILL — Iron Sucrose Inj 20 MG/ML (Fe Equiv): INTRAVENOUS | Qty: 10 | Status: AC

## 2023-03-27 ENCOUNTER — Inpatient Hospital Stay: Payer: BC Managed Care – PPO

## 2023-03-27 VITALS — BP 121/68

## 2023-03-27 DIAGNOSIS — D509 Iron deficiency anemia, unspecified: Secondary | ICD-10-CM | POA: Diagnosis not present

## 2023-03-27 MED ORDER — SODIUM CHLORIDE 0.9 % IV SOLN
Freq: Once | INTRAVENOUS | Status: AC
  Filled 2023-03-27: qty 250

## 2023-03-27 MED ORDER — SODIUM CHLORIDE 0.9 % IV SOLN
200.0000 mg | Freq: Once | INTRAVENOUS | Status: AC
  Administered 2023-03-27: 200 mg via INTRAVENOUS
  Filled 2023-03-27: qty 200

## 2023-03-27 NOTE — Patient Instructions (Signed)

## 2023-03-30 ENCOUNTER — Inpatient Hospital Stay: Payer: BC Managed Care – PPO | Attending: Oncology

## 2023-03-30 VITALS — BP 135/57 | HR 70 | Temp 98.6°F | Resp 18

## 2023-03-30 DIAGNOSIS — D509 Iron deficiency anemia, unspecified: Secondary | ICD-10-CM | POA: Diagnosis not present

## 2023-03-30 MED ORDER — SODIUM CHLORIDE 0.9 % IV SOLN
Freq: Once | INTRAVENOUS | Status: AC
  Filled 2023-03-30: qty 250

## 2023-03-30 MED ORDER — SODIUM CHLORIDE 0.9 % IV SOLN
200.0000 mg | Freq: Once | INTRAVENOUS | Status: AC
  Administered 2023-03-30: 200 mg via INTRAVENOUS
  Filled 2023-03-30: qty 10

## 2023-03-30 NOTE — Patient Instructions (Signed)

## 2023-04-02 MED FILL — Iron Sucrose Inj 20 MG/ML (Fe Equiv): INTRAVENOUS | Qty: 10 | Status: AC

## 2023-04-03 ENCOUNTER — Inpatient Hospital Stay: Payer: BC Managed Care – PPO

## 2023-04-03 VITALS — BP 123/83 | HR 68 | Resp 18

## 2023-04-03 DIAGNOSIS — D509 Iron deficiency anemia, unspecified: Secondary | ICD-10-CM

## 2023-04-03 MED ORDER — SODIUM CHLORIDE 0.9 % IV SOLN
200.0000 mg | Freq: Once | INTRAVENOUS | Status: AC
  Administered 2023-04-03: 200 mg via INTRAVENOUS
  Filled 2023-04-03: qty 200

## 2023-04-03 MED ORDER — SODIUM CHLORIDE 0.9 % IV SOLN
Freq: Once | INTRAVENOUS | Status: AC
  Filled 2023-04-03: qty 250

## 2023-04-03 NOTE — Patient Instructions (Signed)

## 2023-04-14 ENCOUNTER — Encounter: Payer: Self-pay | Admitting: Family Medicine

## 2023-06-01 ENCOUNTER — Ambulatory Visit
Admission: RE | Admit: 2023-06-01 | Discharge: 2023-06-01 | Disposition: A | Discharge status: Home / Self Care | Payer: BC Managed Care – PPO | Source: Ambulatory Visit | Attending: Family Medicine | Admitting: Family Medicine

## 2023-06-01 DIAGNOSIS — Z0000 Encounter for general adult medical examination without abnormal findings: Secondary | ICD-10-CM | POA: Diagnosis present

## 2023-06-01 DIAGNOSIS — Z1231 Encounter for screening mammogram for malignant neoplasm of breast: Secondary | ICD-10-CM | POA: Insufficient documentation

## 2023-06-02 NOTE — Progress Notes (Signed)
Hi Devynne  Normal mammogram; repeat in 1 year.  Please let us know if you have any questions.  Thank you,  Daniell Mancinas, FNP 

## 2023-06-27 ENCOUNTER — Other Ambulatory Visit: Payer: Self-pay | Admitting: Family Medicine

## 2023-06-27 DIAGNOSIS — I10 Essential (primary) hypertension: Secondary | ICD-10-CM

## 2023-07-08 ENCOUNTER — Other Ambulatory Visit: Payer: Self-pay | Admitting: *Deleted

## 2023-07-08 ENCOUNTER — Inpatient Hospital Stay: Payer: BC Managed Care – PPO | Attending: Oncology

## 2023-07-08 DIAGNOSIS — K5190 Ulcerative colitis, unspecified, without complications: Secondary | ICD-10-CM | POA: Insufficient documentation

## 2023-07-08 DIAGNOSIS — D509 Iron deficiency anemia, unspecified: Secondary | ICD-10-CM | POA: Insufficient documentation

## 2023-07-08 DIAGNOSIS — Z79899 Other long term (current) drug therapy: Secondary | ICD-10-CM | POA: Insufficient documentation

## 2023-07-08 MED FILL — Iron Sucrose Inj 20 MG/ML (Fe Equiv): INTRAVENOUS | Qty: 10 | Status: AC

## 2023-07-09 ENCOUNTER — Inpatient Hospital Stay: Payer: BC Managed Care – PPO

## 2023-07-09 ENCOUNTER — Inpatient Hospital Stay: Payer: BC Managed Care – PPO | Admitting: Oncology

## 2023-07-20 ENCOUNTER — Inpatient Hospital Stay: Payer: BC Managed Care – PPO

## 2023-07-20 DIAGNOSIS — D509 Iron deficiency anemia, unspecified: Secondary | ICD-10-CM | POA: Diagnosis present

## 2023-07-20 DIAGNOSIS — Z79899 Other long term (current) drug therapy: Secondary | ICD-10-CM | POA: Diagnosis not present

## 2023-07-20 DIAGNOSIS — K5190 Ulcerative colitis, unspecified, without complications: Secondary | ICD-10-CM | POA: Diagnosis not present

## 2023-07-20 LAB — CBC WITH DIFFERENTIAL/PLATELET
Abs Immature Granulocytes: 0.02 10*3/uL (ref 0.00–0.07)
Basophils Absolute: 0 10*3/uL (ref 0.0–0.1)
Basophils Relative: 1 %
Eosinophils Absolute: 0.1 10*3/uL (ref 0.0–0.5)
Eosinophils Relative: 2 %
HCT: 39.5 % (ref 36.0–46.0)
Hemoglobin: 12.5 g/dL (ref 12.0–15.0)
Immature Granulocytes: 1 %
Lymphocytes Relative: 26 %
Lymphs Abs: 1 10*3/uL (ref 0.7–4.0)
MCH: 28 pg (ref 26.0–34.0)
MCHC: 31.6 g/dL (ref 30.0–36.0)
MCV: 88.4 fL (ref 80.0–100.0)
Monocytes Absolute: 0.2 10*3/uL (ref 0.1–1.0)
Monocytes Relative: 5 %
Neutro Abs: 2.6 10*3/uL (ref 1.7–7.7)
Neutrophils Relative %: 65 %
Platelets: 200 10*3/uL (ref 150–400)
RBC: 4.47 MIL/uL (ref 3.87–5.11)
RDW: 12.9 % (ref 11.5–15.5)
WBC: 3.9 10*3/uL — ABNORMAL LOW (ref 4.0–10.5)
nRBC: 0 % (ref 0.0–0.2)

## 2023-07-20 LAB — IRON AND TIBC
Iron: 54 ug/dL (ref 28–170)
Saturation Ratios: 17 % (ref 10.4–31.8)
TIBC: 326 ug/dL (ref 250–450)
UIBC: 272 ug/dL

## 2023-07-20 LAB — FERRITIN: Ferritin: 35 ng/mL (ref 11–307)

## 2023-07-20 MED FILL — Iron Sucrose Inj 20 MG/ML (Fe Equiv): INTRAVENOUS | Qty: 10 | Status: AC

## 2023-07-21 ENCOUNTER — Inpatient Hospital Stay: Payer: BC Managed Care – PPO

## 2023-07-21 ENCOUNTER — Inpatient Hospital Stay (HOSPITAL_BASED_OUTPATIENT_CLINIC_OR_DEPARTMENT_OTHER): Payer: BC Managed Care – PPO | Admitting: Oncology

## 2023-07-21 ENCOUNTER — Encounter: Payer: Self-pay | Admitting: Oncology

## 2023-07-21 VITALS — BP 112/76 | HR 81 | Temp 98.2°F | Ht 62.0 in | Wt 248.0 lb

## 2023-07-21 DIAGNOSIS — D509 Iron deficiency anemia, unspecified: Secondary | ICD-10-CM

## 2023-07-21 NOTE — Progress Notes (Signed)
Complains of still feeling tired.

## 2023-07-21 NOTE — Progress Notes (Signed)
[facility]  [phone] [phone]) [phone]  ID: Teresa Pitts OB: 1961/07/26  MR#: [id number]  AOZ#:[id number]  Patient Care Team: Jacky Kindle, FNP as PCP - General (Family Medicine) Jeralyn Ruths, MD as Consulting Physician (Oncology)  CHIEF COMPLAINT: Iron deficiency anemia.  INTERVAL HISTORY: Patient returns to clinic today for repeat laboratory work, further evaluation, and consideration of additional IV Venofer.  She continues to have fatigue, but otherwise feels well. She has no neurologic complaints.  She denies any recent fevers or illnesses.  She has a good appetite and denies weight loss.  She has no chest pain, shortness of breath, cough, or hemoptysis.  She denies any nausea, vomiting, constipation, or diarrhea.  She has had no recent melena or hematochezia.  She has no urinary complaints.  Patient offers no further specific complaints today.  REVIEW OF SYSTEMS:   Review of Systems  Constitutional:  Positive for malaise/fatigue. Negative for fever and weight loss.  Respiratory: Negative.  Negative for cough, hemoptysis and shortness of breath.   Cardiovascular: Negative.  Negative for chest pain and leg swelling.  Gastrointestinal: Negative.  Negative for abdominal pain, blood in stool and melena.  Genitourinary: Negative.  Negative for dysuria.  Musculoskeletal: Negative.  Negative for back pain.  Skin: Negative.  Negative for rash.  Neurological: Negative.  Negative for dizziness, focal weakness, weakness and headaches.  Psychiatric/Behavioral: Negative.  The patient is not nervous/anxious.     As per HPI. Otherwise, a complete review of systems is negative.  PAST MEDICAL HISTORY: Past Medical History:  Diagnosis Date   Allergy    Depression     PAST SURGICAL HISTORY: Past Surgical History:  Procedure Laterality Date   ABDOMINAL HYSTERECTOMY  2001   COLONOSCOPY WITH PROPOFOL N/A 09/24/2017   Procedure: COLONOSCOPY WITH  PROPOFOL;  Surgeon: Wyline Mood, MD;  Location: [facility] ENDOSCOPY;  Service: Gastroenterology;  Laterality: N/A;   TONSILLECTOMY AND ADENOIDECTOMY  1980    FAMILY HISTORY: Family History  Problem Relation Age of Onset   Cancer Mother        uterus   Diabetes Father    Hypertension Father     ADVANCED DIRECTIVES (Y/N):  N  HEALTH MAINTENANCE: Social History   Tobacco Use   Smoking status: Never   Smokeless tobacco: Never  Vaping Use   Vaping status: Never Used  Substance Use Topics   Alcohol use: No   Drug use: No     Colonoscopy:  PAP:  Bone density:  Lipid panel:  No Known Allergies  Current Outpatient Medications  Medication Sig Dispense Refill   losartan-hydrochlorothiazide (HYZAAR) 50-12.5 MG tablet Take 1 tablet by mouth daily. Please schedule office visit before any future refill 30 tablet 0   metFORMIN (GLUCOPHAGE-XR) 750 MG 24 hr tablet Take 1 tablet (750 mg total) by mouth daily with breakfast. 30 tablet 11   rosuvastatin (CRESTOR) 10 MG tablet Take 1 tablet (10 mg total) by mouth daily. 30 tablet 11   venlafaxine XR (EFFEXOR-XR) 150 MG 24 hr capsule TAKE 1 CAPSULE BY MOUTH EVERY DAY WITH BREAKFAST 90 capsule 3   Vitamin D, Ergocalciferol, (DRISDOL) 1.25 MG (50000 UNIT) CAPS capsule Take 1 capsule (50,000 Units total) by mouth every 7 (seven) days. 26 capsule 0   methylphenidate (RITALIN LA) 10 MG 24 hr capsule Take 1 capsule (10 mg total) by mouth daily. (Patient not taking: Reported on 03/09/2023) 30 capsule 0   tirzepatide (ZEPBOUND) 2.5 MG/0.5ML Pen Inject 2.5 mg into the  skin once a week. (Patient not taking: Reported on 07/21/2023) 2 mL 0   No current facility-administered medications for this visit.    OBJECTIVE: Vitals:   07/21/23 1423  BP: 112/76  Pulse: 81  Temp: 98.2 F (36.8 C)  SpO2: 98%     Body mass index is 45.36 kg/m.    ECOG FS:0 - Asymptomatic  General: Well-developed, well-nourished, no acute distress. Eyes: Pink conjunctiva,  anicteric sclera. HEENT: Normocephalic, moist mucous membranes. Lungs: No audible wheezing or coughing. Heart: Regular rate and rhythm. Abdomen: Soft, nontender, no obvious distention. Musculoskeletal: No edema, cyanosis, or clubbing. Neuro: Alert, answering all questions appropriately. Cranial nerves grossly intact. Skin: No rashes or petechiae noted. Psych: Normal affect.  LAB RESULTS:  Lab Results  Component Value Date   NA 143 03/23/2023   K 4.1 03/23/2023   CL 103 03/23/2023   CO2 25 03/23/2023   GLUCOSE 104 (H) 03/23/2023   BUN 11 03/23/2023   CREATININE 0.78 03/23/2023   CALCIUM 8.9 03/23/2023   PROT 6.6 02/24/2023   ALBUMIN 4.0 02/24/2023   AST 12 02/24/2023   ALT 10 02/24/2023   ALKPHOS 65 02/24/2023   BILITOT <0.2 02/24/2023   GFRNONAA 73 04/27/2020   GFRAA 84 04/27/2020    Lab Results  Component Value Date   WBC 3.9 (L) 07/20/2023   NEUTROABS 2.6 07/20/2023   HGB 12.5 07/20/2023   HCT 39.5 07/20/2023   MCV 88.4 07/20/2023   PLT 200 07/20/2023   Lab Results  Component Value Date   IRON 54 07/20/2023   TIBC 326 07/20/2023   IRONPCTSAT 17 07/20/2023   Lab Results  Component Value Date   FERRITIN 35 07/20/2023     STUDIES: No results found.  ASSESSMENT: Iron deficiency anemia.  PLAN:    Iron deficiency anemia: Patient's hemoglobin and iron stores are now within normal limits.  Previously, all of her other laboratory work was either negative or within normal limits.  She does not require additional IV Venofer at this time.  Patient last received treatment on April 03, 2023.  Return to clinic in 4 months with repeat laboratory work, further evaluation, and continuation of treatment if needed.   Ulcerative colitis: Patient reports she currently is not taking treatment.  Continue follow-up with GI as recommended.  I provided 20 minutes of face-to-face video visit time during this encounter which included chart review, counseling, and coordination of  care as documented above.    Patient expressed understanding and was in agreement with this plan. She also understands that She can call clinic at any time with any questions, concerns, or complaints.    Jeralyn Ruths, MD   07/21/2023 3:58 PM

## 2023-07-24 ENCOUNTER — Other Ambulatory Visit: Payer: Self-pay | Admitting: Family Medicine

## 2023-07-24 DIAGNOSIS — I10 Essential (primary) hypertension: Secondary | ICD-10-CM

## 2023-07-24 NOTE — Telephone Encounter (Signed)
Requested Prescriptions  Pending Prescriptions Disp Refills   losartan-hydrochlorothiazide (HYZAAR) 50-12.5 MG tablet [Pharmacy Med Name: LOSARTAN-HCTZ 50-12.5 MG TAB] 90 tablet 0    Sig: TAKE 1 TABLET BY MOUTH DAILY. PLEASE SCHEDULE OFFICE VISIT BEFORE ANY FUTURE REFILL     Cardiovascular: ARB + Diuretic Combos Passed - 07/24/2023  8:31 AM      Passed - K in normal range and within 180 days    Potassium  Date Value Ref Range Status  03/23/2023 4.1 3.5 - 5.2 mmol/L Final         Passed - Na in normal range and within 180 days    Sodium  Date Value Ref Range Status  03/23/2023 143 134 - 144 mmol/L Final         Passed - Cr in normal range and within 180 days    Creatinine, Ser  Date Value Ref Range Status  03/23/2023 0.78 0.57 - 1.00 mg/dL Final         Passed - eGFR is 10 or above and within 180 days    GFR calc Af Amer  Date Value Ref Range Status  04/27/2020 84 >59 mL/min/1.73 Final    Comment:    **[facility] currently reports eGFR in compliance with the current**   recommendations of the [employer]. [facility] will   update reporting as new guidelines are published from the NKF-ASN   Task force.    GFR calc non Af Amer  Date Value Ref Range Status  04/27/2020 73 >59 mL/min/1.73 Final   eGFR  Date Value Ref Range Status  03/23/2023 87 >59 mL/min/1.73 Final         Passed - Patient is not pregnant      Passed - Last BP in normal range    BP Readings from Last 1 Encounters:  07/21/23 112/76         Passed - Valid encounter within last 6 months    Recent Outpatient Visits           4 months ago Primary hypertension   [facility] [address] Jacky Kindle, FNP   5 months ago Annual physical exam   [facility] Merita Norton T, FNP   8 months ago Fever, unspecified fever cause   [facility] Merita Norton T, FNP   1 year ago Adjustment disorder with anxious mood   [place]  [facility] Merita Norton T, FNP   3 years ago Routine general medical examination at a health care facility   [facility], [place], [place]

## 2023-09-22 ENCOUNTER — Ambulatory Visit: Payer: Self-pay | Admitting: *Deleted

## 2023-09-22 ENCOUNTER — Emergency Department: Payer: BC Managed Care – PPO

## 2023-09-22 ENCOUNTER — Emergency Department
Admission: EM | Admit: 2023-09-22 | Discharge: 2023-09-22 | Disposition: A | Discharge status: Home / Self Care | Payer: BC Managed Care – PPO | Attending: Emergency Medicine | Admitting: Emergency Medicine

## 2023-09-22 ENCOUNTER — Other Ambulatory Visit: Payer: Self-pay

## 2023-09-22 DIAGNOSIS — R0789 Other chest pain: Secondary | ICD-10-CM | POA: Insufficient documentation

## 2023-09-22 DIAGNOSIS — Z79899 Other long term (current) drug therapy: Secondary | ICD-10-CM | POA: Insufficient documentation

## 2023-09-22 DIAGNOSIS — I10 Essential (primary) hypertension: Secondary | ICD-10-CM | POA: Insufficient documentation

## 2023-09-22 DIAGNOSIS — R079 Chest pain, unspecified: Secondary | ICD-10-CM

## 2023-09-22 DIAGNOSIS — R110 Nausea: Secondary | ICD-10-CM | POA: Insufficient documentation

## 2023-09-22 LAB — BASIC METABOLIC PANEL
Anion gap: 12 (ref 5–15)
BUN: 14 mg/dL (ref 6–20)
CO2: 29 mmol/L (ref 22–32)
Calcium: 9.1 mg/dL (ref 8.9–10.3)
Chloride: 98 mmol/L (ref 98–111)
Creatinine, Ser: 0.84 mg/dL (ref 0.44–1.00)
GFR, Estimated: >60 mL/min (ref >60)
Glucose, Bld: 101 mg/dL — ABNORMAL HIGH (ref 70–99)
Potassium: 3.5 mmol/L (ref 3.5–5.1)
Sodium: 139 mmol/L (ref 135–145)

## 2023-09-22 LAB — LIPASE, BLOOD: Lipase: 37 U/L (ref 11–51)

## 2023-09-22 LAB — HEPATIC FUNCTION PANEL
ALT: 18 U/L (ref 0–44)
AST: 19 U/L (ref 15–41)
Albumin: 4 g/dL (ref 3.5–5.0)
Alkaline Phosphatase: 51 U/L (ref 38–126)
Bilirubin, Direct: <0.1 mg/dL (ref 0.0–0.2)
Total Bilirubin: 0.5 mg/dL (ref 0.3–1.2)
Total Protein: 7 g/dL (ref 6.5–8.1)

## 2023-09-22 LAB — CBC
HCT: 41.5 % (ref 36.0–46.0)
Hemoglobin: 13.6 g/dL (ref 12.0–15.0)
MCH: 28.1 pg (ref 26.0–34.0)
MCHC: 32.8 g/dL (ref 30.0–36.0)
MCV: 85.7 fL (ref 80.0–100.0)
Platelets: 248 10*3/uL (ref 150–400)
RBC: 4.84 MIL/uL (ref 3.87–5.11)
RDW: 12.6 % (ref 11.5–15.5)
WBC: 6.8 10*3/uL (ref 4.0–10.5)
nRBC: 0 % (ref 0.0–0.2)

## 2023-09-22 LAB — TROPONIN I (HIGH SENSITIVITY): Troponin I (High Sensitivity): 4 ng/L (ref <18)

## 2023-09-22 MED ORDER — ONDANSETRON HCL 4 MG/2ML IJ SOLN
4.0000 mg | Freq: Once | INTRAMUSCULAR | Status: AC
  Administered 2023-09-22: 4 mg via INTRAVENOUS
  Filled 2023-09-22: qty 2

## 2023-09-22 MED ORDER — ALUM & MAG HYDROXIDE-SIMETH 200-200-20 MG/5ML PO SUSP
30.0000 mL | Freq: Once | ORAL | Status: AC
  Administered 2023-09-22: 30 mL via ORAL
  Filled 2023-09-22: qty 30

## 2023-09-22 MED ORDER — ONDANSETRON HCL 4 MG PO TABS: 4.0000 mg | ORAL_TABLET | Freq: Four times a day (QID) | ORAL | 0 refills | Status: AC | PRN

## 2023-09-22 MED ORDER — SODIUM CHLORIDE 0.9 % IV BOLUS
1000.0000 mL | Freq: Once | INTRAVENOUS | Status: AC
  Administered 2023-09-22: 1000 mL via INTRAVENOUS

## 2023-09-22 MED ORDER — LIDOCAINE VISCOUS HCL 2 % MT SOLN
15.0000 mL | Freq: Once | OROMUCOSAL | Status: AC
  Administered 2023-09-22: 15 mL via OROMUCOSAL
  Filled 2023-09-22: qty 15

## 2023-09-22 NOTE — ED Notes (Signed)
Pt A&O x4, no obvious distress noted, respirations regular/unlabored. Pt verbalizes understanding of discharge instructions. Pt able to ambulate from ED independently.   

## 2023-09-22 NOTE — Discharge Instructions (Signed)
You were seen in the emergency department today for evaluation of your chest pain and nausea.  Your testing was overall reassuring.  I suspect that your pain could be related to irritation of your stomach lining known as gastritis, but it is important that you follow-up with your primary care doctor for further evaluation.  I sent a prescription for nausea medicine to your pharmacy that you can take as needed.  I also recommend starting omeprazole and taking this daily for the next 2 weeks.  Return to the ER for any new or worsening symptoms including worsening chest pain, difficulty breathing, or any other new or concerning symptoms.

## 2023-09-22 NOTE — Telephone Encounter (Signed)
  Chief Complaint: Chest pressure in middle of breast bone and at the base of her throat making it feel difficult to swallow.    Yesterday had chest pain and tingling all the way down her right arm.   Today it's been a pressure in her chest but no pain. Symptoms: above Frequency: Yesterday and today Pertinent Negatives: Patient denies sweating, shortness of breath, dizziness Disposition: [x] ED /[] Urgent Care (no appt availability in office) / [] Appointment(In office/virtual)/ []  [place] [facility]/ [] Home Care/ [] Refused Recommended Disposition /[]  Mobile Bus/ []  Follow-up with PCP Additional Notes: Referred to the ED.   Agreeable to going and going to [facility].   Her husband is going to meet her there.   I let her know she should not drive herself but she stated,  "I feel fine other than this pressure in my chest".    My husband is going to meet me at the ED.

## 2023-09-22 NOTE — ED Triage Notes (Signed)
Pt to ED for intermittent chest pain radiating to right arm for one week. +nausea. NAD noted

## 2023-09-22 NOTE — Telephone Encounter (Signed)
Reason for Disposition  Pain also in shoulder(s) or arm(s) or jaw  (Exception: Pain is clearly made worse by movement.)  Answer Assessment - Initial Assessment Questions 1. LOCATION: "Where does it hurt?"       Last couple of days I've had chest pain in the middle of my chest and my right arm is tingling and I can't swallow easily.  It's a little sore, my throat.   My chest bone above it is feels like there is pressure.    It's hard to swallow.   It feels like a pressure at the base of my throat.     It's was painful yesterday but today it's pressure.    I have tingling down my right arm when I had the chest pain yesterday.    No shortness of breath or sweating.    Denies having pain in between her shoulder blades, neck or jaws.    2. RADIATION: "Does the pain go anywhere else?" (e.g., into neck, jaw, arms, back)     No 3. ONSET: "When did the chest pain begin?" (Minutes, hours or days)      Yesterday.   I'm feeling pressure now.   I have this pressure in my chest in the middle of my breast bone. 4. PATTERN: "Does the pain come and go, or has it been constant since it started?"  "Does it get worse with exertion?"      Constant pressure today.   Yesterday the pain would come and go. 5. DURATION: "How long does it last" (e.g., seconds, minutes, hours)     Constant pressure today 6. SEVERITY: "How bad is the pain?"  (e.g., Scale 1-10; mild, moderate, or severe)    - MILD (1-3): doesn't interfere with normal activities     - MODERATE (4-7): interferes with normal activities or awakens from sleep    - SEVERE (8-10): excruciating pain, unable to do any normal activities       Not asked    Referred to the ED. 7. CARDIAC RISK FACTORS: "Do you have any history of heart problems or risk factors for heart disease?" (e.g., angina, prior heart attack; diabetes, high blood pressure, high cholesterol, smoker, or strong family history of heart disease)     No 8. PULMONARY RISK FACTORS: "Do you have any  history of lung disease?"  (e.g., blood clots in lung, asthma, emphysema, birth control pills)     Not asked 9. CAUSE: "What do you think is causing the chest pain?"     Not asked 10. OTHER SYMPTOMS: "Do you have any other symptoms?" (e.g., dizziness, nausea, vomiting, sweating, fever, difficulty breathing, cough)       Denies other symptoms today.   I feel fine other than this pressure in my chest and at the bottom of my throat making it hard to swallow.   Like something is stuck there. 11. PREGNANCY: "Is there any chance you are pregnant?" "When was your last menstrual period?"       N/A due to age  Protocols used: Chest Pain-A-AH

## 2023-09-22 NOTE — ED Provider Notes (Signed)
[facility] Provider Note    Event Date/Time   First MD Initiated Contact with Patient 09/22/23 1619     (approximate)   History   Chest Pain   HPI  Teresa Pitts is a 62 year old female with history of hypertension presenting to the emergency department for evaluation of chest pain.  Patient reports that for the last week she has had some intermittent discomfort over her right chest into her right arm.  Not specifically exertional or pleuritic.  No associated shortness of breath.  This morning she developed some pressure in the center of her chest with associated nausea.  Her pain has been worse with eating.  No abdominal pain.  No vomiting.  Has a history of distant reflux, but not currently taking medications for this.  Tried taking omeprazole earlier today without benefit.  No fevers or chills.  Denies history of similar.      Physical Exam   Triage Vital Signs: ED Triage Vitals [09/22/23 1453]  Encounter Vitals Group     BP (!) 146/87     Systolic BP Percentile      Diastolic BP Percentile      Pulse Rate 94     Resp 20     Temp 98.6 F (37 C)     Temp src      SpO2 98 %     Weight 245 lb (111.1 kg)     Height 5\' 2"  (1.575 m)     Head Circumference      Peak Flow      Pain Score 5     Pain Loc      Pain Education      Exclude from Growth Chart     Most recent vital signs: Vitals:   09/22/23 1453  BP: (!) 146/87  Pulse: 94  Resp: 20  Temp: 98.6 F (37 C)  SpO2: 98%     General: Awake, interactive  CV:  Regular rate, good peripheral perfusion.  Resp:  Lungs clear, unlabored respirations.  Abd:  Soft, nondistended, nontender to palpation diffusely Neuro:  Symmetric facial movement, fluid speech   ED Results / Procedures / Treatments   Labs (all labs ordered are listed, but only abnormal results are displayed) Labs Reviewed  BASIC METABOLIC PANEL - Abnormal; Notable for the following components:      Result Value   Glucose,  Bld 101 (*)    All other components within normal limits  CBC  HEPATIC FUNCTION PANEL  LIPASE, BLOOD  TROPONIN I (HIGH SENSITIVITY)     EKG EKG independently reviewed interpreted by myself (ER attending) demonstrates:  EKG demonstrates normal sinus rhythm at a rate of 93, PR 174, QRS 76, QTc 452, no acute ST changes  RADIOLOGY Imaging independently reviewed and interpreted by myself demonstrates:  CXR without focal consolidation  PROCEDURES:  Critical Care performed: No  Procedures   MEDICATIONS ORDERED IN ED: Medications  sodium chloride 0.9 % bolus 1,000 mL (1,000 mLs Intravenous New Bag/Given 09/22/23 1649)  ondansetron (ZOFRAN) injection 4 mg (4 mg Intravenous Given 09/22/23 1650)  alum & mag hydroxide-simeth (MAALOX/MYLANTA) 200-200-20 MG/5ML suspension 30 mL (30 mLs Oral Given 09/22/23 1641)  lidocaine (XYLOCAINE) 2 % viscous mouth solution 15 mL (15 mLs Mouth/Throat Given 09/22/23 1642)     IMPRESSION / MDM / ASSESSMENT AND PLAN / ED COURSE  I reviewed the triage vital signs and the nursing notes.  Differential diagnosis includes, but is not limited to, ACS, pneumonia,  pneumothorax, GERD, gastritis, pancreatitis, lower suspicion biliary pathology in the absence of any abdominal pain, very low suspicion PE, aortic dissection based on clinical history  Patient's presentation is most consistent with acute presentation with potential threat to life or bodily function.  62 year old female presenting to the emergency department for evaluation of chest pain.  Vital signs stable on presentation.  Labs from triage are reassuring, given associated nausea and concern for possible GI chest pain, lipase and LFTs were added which remained reassuring.  X-Tonyia Marschall without focal consolidation on my review.  EKG without acute ischemic findings.  Clinical Course as of 09/22/23 1722  Tue Sep 22, 2023  1717 Reassessed, feels significantly improved after GI cocktail.  Does have some ongoing mild  discomfort.  X-Ruvi Fullenwider read pending.  Discussed repeat troponin, but patient prefers earlier discharge without this which I do think is reasonable as her pain has been going on for well over 3 hours. [NR]  1719 Chest x-Jolly Bleicher without acute findings on my review and radiology interpretation.  Do think patient stable for discharge.  Strict return precautions provided.  Patient discharged with plans for close outpatient follow-up. [NR]    Clinical Course User Index [NR] Trinna Post, MD     FINAL CLINICAL IMPRESSION(S) / ED DIAGNOSES   Final diagnoses:  Nonspecific chest pain  Nausea     Rx / DC Orders   ED Discharge Orders          Ordered    ondansetron (ZOFRAN) 4 MG tablet  Every 6 hours PRN        09/22/23 1722             Note:  This document was prepared using Dragon voice recognition software and may include unintentional dictation errors.   Trinna Post, MD 09/22/23 [phone]

## 2023-09-23 ENCOUNTER — Ambulatory Visit (INDEPENDENT_AMBULATORY_CARE_PROVIDER_SITE_OTHER): Payer: BC Managed Care – PPO | Admitting: Family Medicine

## 2023-09-23 ENCOUNTER — Encounter: Payer: Self-pay | Admitting: Family Medicine

## 2023-09-23 VITALS — BP 119/83 | HR 78 | Temp 98.1°F | Ht 62.0 in | Wt 248.3 lb

## 2023-09-23 DIAGNOSIS — Z23 Encounter for immunization: Secondary | ICD-10-CM | POA: Insufficient documentation

## 2023-09-23 DIAGNOSIS — F4322 Adjustment disorder with anxiety: Secondary | ICD-10-CM | POA: Insufficient documentation

## 2023-09-23 DIAGNOSIS — R0789 Other chest pain: Secondary | ICD-10-CM | POA: Insufficient documentation

## 2023-09-23 DIAGNOSIS — K2100 Gastro-esophageal reflux disease with esophagitis, without bleeding: Secondary | ICD-10-CM | POA: Diagnosis not present

## 2023-09-23 DIAGNOSIS — R7303 Prediabetes: Secondary | ICD-10-CM | POA: Insufficient documentation

## 2023-09-23 DIAGNOSIS — Z7984 Long term (current) use of oral hypoglycemic drugs: Secondary | ICD-10-CM

## 2023-09-23 DIAGNOSIS — F5102 Adjustment insomnia: Secondary | ICD-10-CM | POA: Insufficient documentation

## 2023-09-23 DIAGNOSIS — Z09 Encounter for follow-up examination after completed treatment for conditions other than malignant neoplasm: Secondary | ICD-10-CM | POA: Insufficient documentation

## 2023-09-23 DIAGNOSIS — E559 Vitamin D deficiency, unspecified: Secondary | ICD-10-CM

## 2023-09-23 DIAGNOSIS — I2510 Atherosclerotic heart disease of native coronary artery without angina pectoris: Secondary | ICD-10-CM

## 2023-09-23 DIAGNOSIS — I10 Essential (primary) hypertension: Secondary | ICD-10-CM

## 2023-09-23 MED ORDER — PANTOPRAZOLE SODIUM 40 MG PO TBEC
40.0000 mg | DELAYED_RELEASE_TABLET | Freq: Two times a day (BID) | ORAL | 1 refills | Status: AC
Start: 2023-09-23 — End: ?

## 2023-09-23 MED ORDER — BUSPIRONE HCL 5 MG PO TABS
5.0000 mg | ORAL_TABLET | Freq: Three times a day (TID) | ORAL | 0 refills | Status: DC | PRN
Start: 2023-09-23 — End: 2023-10-20

## 2023-09-23 MED ORDER — TRAZODONE HCL 50 MG PO TABS
ORAL_TABLET | ORAL | 0 refills | Status: DC
Start: 2023-09-23 — End: 2023-10-20

## 2023-09-23 NOTE — Assessment & Plan Note (Signed)
Chronic, at goal; continue hyzaar 50-12.5 mg daily

## 2023-09-23 NOTE — Assessment & Plan Note (Signed)
Chronic, repeat LP The 10-year ASCVD risk score (Arnett DK, et al., 2019) is: 3.3% Remains on Crestor 10 daily

## 2023-09-23 NOTE — Assessment & Plan Note (Signed)
Seen for atypical CP; slight improvement with GI cocktail Continue to monitor s/s

## 2023-09-23 NOTE — Progress Notes (Signed)
Established patient visit   Patient: Teresa Pitts   DOB: [birth year]   61 y.o. Female  MRN: [id number] Visit Date: 09/23/2023  Today's healthcare provider: Jacky Kindle, FNP  Introduced to nurse practitioner role and practice setting.  All questions answered.  Discussed provider/patient relationship and expectations.  Subjective    HPI   Medications: Outpatient Medications Prior to Visit  Medication Sig   losartan-hydrochlorothiazide (HYZAAR) 50-12.5 MG tablet TAKE 1 TABLET BY MOUTH DAILY. PLEASE SCHEDULE OFFICE VISIT BEFORE ANY FUTURE REFILL   metFORMIN (GLUCOPHAGE-XR) 750 MG 24 hr tablet Take 1 tablet (750 mg total) by mouth daily with breakfast.   ondansetron (ZOFRAN) 4 MG tablet Take 1 tablet (4 mg total) by mouth every 6 (six) hours as needed for up to 7 days for nausea or vomiting.   rosuvastatin (CRESTOR) 10 MG tablet Take 1 tablet (10 mg total) by mouth daily.   venlafaxine XR (EFFEXOR-XR) 150 MG 24 hr capsule TAKE 1 CAPSULE BY MOUTH EVERY DAY WITH BREAKFAST   Vitamin D, Ergocalciferol, (DRISDOL) 1.25 MG (50000 UNIT) CAPS capsule Take 1 capsule (50,000 Units total) by mouth every 7 (seven) days.   [DISCONTINUED] mesalamine (ROWASA) 4 g enema Place 60 mLs (4 g total) rectally at bedtime. (Patient not taking: Reported on 04/12/2018)   [DISCONTINUED] methylphenidate (RITALIN LA) 10 MG 24 hr capsule Take 1 capsule (10 mg total) by mouth daily. (Patient not taking: Reported on 03/09/2023)   [DISCONTINUED] tirzepatide (ZEPBOUND) 2.5 MG/0.5ML Pen Inject 2.5 mg into the skin once a week. (Patient not taking: Reported on 07/21/2023)   No facility-administered medications prior to visit.    Review of Systems      Objective    BP 119/83 (BP Location: Left Arm, Patient Position: Sitting, Cuff Size: Normal)   Pulse 78   Temp 98.1 F (36.7 C) (Oral)   Ht 5\' 2"  (1.575 m)   Wt 248 lb 4.8 oz (112.6 kg)   SpO2 99%   BMI 45.41 kg/m     Physical Exam Vitals and nursing note  reviewed.  Constitutional:      General: She is not in acute distress.    Appearance: Normal appearance. She is obese. She is not ill-appearing, toxic-appearing or diaphoretic.  HENT:     Head: Normocephalic and atraumatic.  Cardiovascular:     Rate and Rhythm: Normal rate and regular rhythm.     Pulses: Normal pulses.     Heart sounds: Normal heart sounds. No murmur heard.    No friction rub. No gallop.  Pulmonary:     Effort: Pulmonary effort is normal. No respiratory distress.     Breath sounds: Normal breath sounds. No stridor. No wheezing, rhonchi or rales.  Chest:     Chest wall: No tenderness.  Abdominal:     General: Bowel sounds are normal.     Palpations: Abdomen is soft.     Comments: Complaints of epigastric fullness and worsening GERD  Musculoskeletal:        General: No swelling, tenderness, deformity or signs of injury. Normal range of motion.     Right lower leg: No edema.     Left lower leg: No edema.  Skin:    General: Skin is warm and dry.     Capillary Refill: Capillary refill takes less than 2 seconds.     Coloration: Skin is not jaundiced or pale.     Findings: No bruising, erythema, lesion or rash.  Neurological:     General:  No focal deficit present.     Mental Status: She is alert and oriented to person, place, and time. Mental status is at baseline.     Cranial Nerves: No cranial nerve deficit.     Sensory: No sensory deficit.     Motor: No weakness.     Coordination: Coordination normal.  Psychiatric:        Mood and Affect: Mood normal.        Behavior: Behavior normal.        Thought Content: Thought content normal.        Judgment: Judgment normal.     No results found for any visits on 09/23/23.  Assessment & Plan     Problem List Items Addressed This Visit       Cardiovascular and Mediastinum   ASCVD (arteriosclerotic cardiovascular disease)    Chronic, repeat LP The 10-year ASCVD risk score (Arnett DK, et al., 2019) is:  3.3% Remains on Crestor 10 daily      Relevant Orders   Lipid panel   Primary hypertension    Chronic, at goal; continue hyzaar 50-12.5 mg daily        Digestive   Gastroesophageal reflux disease with esophagitis without hemorrhage - Primary    Chronic, worsening Recommend BID dosing protonix to assist at 40 mg; can scale back if symptoms improved If symptoms worsen, recommend EGD consult with gastro Continue to work on weight mgmt       Relevant Medications   pantoprazole (PROTONIX) 40 MG tablet     Other   Adjustment disorder with anxiety    Chronic, worsening Reports lots of stressors Will add buspar 5 mg TID PRN to assist and low dose trazodone at night 25-50 mg at bedtime prn Continue to monitor Defer changes in Effexor; currently on 150 mg daily       Relevant Medications   busPIRone (BUSPAR) 5 MG tablet   RESOLVED: Adjustment insomnia   Relevant Medications   traZODone (DESYREL) 50 MG tablet   Atypical chest pain    Chronic, negative cardiac workup Continue to monitor anxiety and GERD symptoms  Both addressed       Avitaminosis D    Chronic, previously on supplementation Repeat labs       Relevant Orders   Vitamin D (25 hydroxy)   Hospital discharge follow-up    Seen for atypical CP; slight improvement with GI cocktail Continue to monitor s/s       Need for influenza vaccination   Relevant Orders   Flu vaccine trivalent PF, 6mos and older(Flulaval,Afluria,Fluarix,Fluzone) (Completed)   Prediabetes    Chronic, stable; repeat A1c Continue to recommend balanced, lower carb meals. Smaller meal size, adding snacks. Choosing water as drink of choice and increasing purposeful exercise. Remains on metformin 750 mg once daily       Relevant Orders   Hemoglobin A1c    Return in about 4 weeks (around 10/21/2023), or if symptoms worsen or fail to improve, for anxiety and depression.      Leilani Merl, FNP, have reviewed all documentation for this  visit. The documentation on 09/23/23 for the exam, diagnosis, procedures, and orders are all accurate and complete.  Jacky Kindle, FNP  [facility] [phone] (phone) [phone] (fax)  [facility]

## 2023-09-23 NOTE — Assessment & Plan Note (Signed)
Chronic, stable; repeat A1c Continue to recommend balanced, lower carb meals. Smaller meal size, adding snacks. Choosing water as drink of choice and increasing purposeful exercise. Remains on metformin 750 mg once daily

## 2023-09-23 NOTE — Assessment & Plan Note (Signed)
Chronic, negative cardiac workup Continue to monitor anxiety and GERD symptoms  Both addressed

## 2023-09-23 NOTE — Assessment & Plan Note (Signed)
Chronic, worsening Recommend BID dosing protonix to assist at 40 mg; can scale back if symptoms improved If symptoms worsen, recommend EGD consult with gastro Continue to work on weight mgmt

## 2023-09-23 NOTE — Assessment & Plan Note (Signed)
Chronic, previously on supplementation Repeat labs

## 2023-09-23 NOTE — Assessment & Plan Note (Signed)
Chronic, worsening Reports lots of stressors Will add buspar 5 mg TID PRN to assist and low dose trazodone at night 25-50 mg at bedtime prn Continue to monitor Defer changes in Effexor; currently on 150 mg daily

## 2023-09-24 LAB — LIPID PANEL
Chol/HDL Ratio: 2.2 ratio (ref 0.0–4.4)
Cholesterol, Total: 186 mg/dL (ref 100–199)
HDL: 84 mg/dL (ref >39)
LDL Chol Calc (NIH): 87 mg/dL (ref 0–99)
Triglycerides: 83 mg/dL (ref 0–149)
VLDL Cholesterol Cal: 15 mg/dL (ref 5–40)

## 2023-09-24 LAB — VITAMIN D 25 HYDROXY (VIT D DEFICIENCY, FRACTURES): Vit D, 25-Hydroxy: 26.2 ng/mL — ABNORMAL LOW (ref 30.0–100.0)

## 2023-09-24 LAB — HEMOGLOBIN A1C
Est. average glucose Bld gHb Est-mCnc: 131 mg/dL
Hgb A1c MFr Bld: 6.2 % — ABNORMAL HIGH (ref 4.8–5.6)

## 2023-10-15 ENCOUNTER — Other Ambulatory Visit: Payer: Self-pay | Admitting: Family Medicine

## 2023-10-15 DIAGNOSIS — F5102 Adjustment insomnia: Secondary | ICD-10-CM

## 2023-10-15 DIAGNOSIS — F4322 Adjustment disorder with anxiety: Secondary | ICD-10-CM

## 2023-10-15 DIAGNOSIS — K2100 Gastro-esophageal reflux disease with esophagitis, without bleeding: Secondary | ICD-10-CM

## 2023-10-15 NOTE — Telephone Encounter (Signed)
Requests too soon for refill.  Requested Prescriptions  Pending Prescriptions Disp Refills   busPIRone (BUSPAR) 5 MG tablet [Pharmacy Med Name: BUSPIRONE HCL 5 MG TABLET] 270 tablet 1    Sig: TAKE 1 TABLET BY MOUTH 3 TIMES DAILY AS NEEDED.     Psychiatry: Anxiolytics/Hypnotics - Non-controlled Passed - 10/15/2023  8:33 AM      Passed - Valid encounter within last 12 months    Recent Outpatient Visits           3 weeks ago Gastroesophageal reflux disease with esophagitis without hemorrhage   [facility] Jacky Kindle, FNP   6 months ago Primary hypertension   [facility] Jacky Kindle, FNP   7 months ago Annual physical exam   [facility] Merita Norton T, FNP   11 months ago Fever, unspecified fever cause   [facility] Merita Norton T, FNP   1 year ago Adjustment disorder with anxious mood   [place] [facility] Jacky Kindle, FNP       Future Appointments             In 5 days Jacky Kindle, FNP [facility]             pantoprazole (PROTONIX) 40 MG tablet [Pharmacy Med Name: PANTOPRAZOLE SOD DR 40 MG TAB] 180 tablet 1    Sig: TAKE 1 TABLET (40 MG TOTAL) BY MOUTH TWICE A DAY BEFORE MEALS     Gastroenterology: Proton Pump Inhibitors Passed - 10/15/2023  8:33 AM      Passed - Valid encounter within last 12 months    Recent Outpatient Visits           3 weeks ago Gastroesophageal reflux disease with esophagitis without hemorrhage   [facility] Jacky Kindle, FNP   6 months ago Primary hypertension   [place] [facility] Jacky Kindle, FNP   7 months ago Annual physical exam   [facility] Merita Norton T, FNP   11 months ago Fever, unspecified fever cause   [place] Merita Norton T, FNP   1 year ago Adjustment  disorder with anxious mood   [facility] Jacky Kindle, FNP       Future Appointments             In 5 days Jacky Kindle, FNP [facility]             traZODone (DESYREL) 50 MG tablet [Pharmacy Med Name: TRAZODONE 50 MG TABLET] 90 tablet 1    Sig: TAKE HALF TO ONE TABLET BY MOUTH AS NEEDED FOR SLEEP     Psychiatry: Antidepressants - Serotonin Modulator Passed - 10/15/2023  8:33 AM      Passed - Valid encounter within last 6 months    Recent Outpatient Visits           3 weeks ago Gastroesophageal reflux disease with esophagitis without hemorrhage   [facility] Jacky Kindle, FNP   6 months ago Primary hypertension    [facility] Jacky Kindle, FNP   7 months ago Annual physical exam   [facility] Merita Norton T, FNP   11 months ago Fever, unspecified fever cause   [facility] Jacky Kindle, FNP   1 year ago Adjustment disorder with anxious mood   [place] [facility] Jacky Kindle, FNP       Future Appointments             In 5 days Jacky Kindle, FNP [facility]

## 2023-10-20 ENCOUNTER — Ambulatory Visit (INDEPENDENT_AMBULATORY_CARE_PROVIDER_SITE_OTHER): Payer: BC Managed Care – PPO | Admitting: Family Medicine

## 2023-10-20 ENCOUNTER — Encounter: Payer: Self-pay | Admitting: Family Medicine

## 2023-10-20 ENCOUNTER — Other Ambulatory Visit: Payer: Self-pay | Admitting: Family Medicine

## 2023-10-20 VITALS — BP 134/72 | HR 82 | Temp 98.8°F | Resp 16 | Ht 62.0 in | Wt 250.0 lb

## 2023-10-20 DIAGNOSIS — F4322 Adjustment disorder with anxiety: Secondary | ICD-10-CM

## 2023-10-20 DIAGNOSIS — I872 Venous insufficiency (chronic) (peripheral): Secondary | ICD-10-CM | POA: Insufficient documentation

## 2023-10-20 DIAGNOSIS — N3941 Urge incontinence: Secondary | ICD-10-CM | POA: Diagnosis not present

## 2023-10-20 DIAGNOSIS — I10 Essential (primary) hypertension: Secondary | ICD-10-CM | POA: Diagnosis not present

## 2023-10-20 DIAGNOSIS — R0789 Other chest pain: Secondary | ICD-10-CM

## 2023-10-20 DIAGNOSIS — K2100 Gastro-esophageal reflux disease with esophagitis, without bleeding: Secondary | ICD-10-CM | POA: Diagnosis not present

## 2023-10-20 DIAGNOSIS — F5102 Adjustment insomnia: Secondary | ICD-10-CM

## 2023-10-20 MED ORDER — LOSARTAN POTASSIUM-HCTZ 50-12.5 MG PO TABS
1.0000 | ORAL_TABLET | Freq: Every day | ORAL | 3 refills | Status: DC
Start: 2023-10-20 — End: 2024-10-24

## 2023-10-20 MED ORDER — TIRZEPATIDE-WEIGHT MANAGEMENT 2.5 MG/0.5ML ~~LOC~~ SOAJ: 2.5000 mg | SUBCUTANEOUS | 0 refills | Status: AC

## 2023-10-20 NOTE — Telephone Encounter (Signed)
Please advise 

## 2023-10-20 NOTE — Progress Notes (Signed)
Established patient visit   Patient: Teresa Pitts   DOB: [birth year]   61 y.o. Female  MRN: [id number] Visit Date: 10/20/2023  Today's healthcare provider: Jacky Kindle, FNP  Introduced to nurse practitioner role and practice setting.  All questions answered.  Discussed provider/patient relationship and expectations.  Subjective    HPI HPI     Gastroesophageal Reflux    Additional comments: Patient was put on Protonix about 1 month ago.  She states she is feeling much better.  She reports good compliance and tolerance.         Edema    Additional comments: Patient complains of swelling from her knees down for the couple of weeks.  She states it is worse at night and in the morning better but not always her normal.  She is propping her feet during the day.      Last edited by Adline Peals, CMA on 10/20/2023  9:29 AM.      The patient, with a history of ulcerative colitis, presents with worsening leg swelling and intermittent incontinence. She reports that the swelling has been severe enough to leave dents in her legs and ankles. The swelling is associated with pain that radiates down her legs into her ankles and feet, which she describes as plantar fasciitis. She has been managing the pain with insoles and compression stockings, but has had to stop wearing the stockings due to an allergic reaction that resulted in a slow-healing chemical burn-like rash, on L leg.  The patient also reports episodes of sudden, uncontrollable urination. These episodes are unpredictable, occurring in waves and not associated with any particular triggers such as coughing or sneezing. The volume of urine released during these episodes is significant, enough to soak her pants down to her knees.  The patient's mood is generally good, despite the stress of managing multiple commitments. She expresses a strong desire to lose weight, both for general health and in preparation for knee surgery. She requests a  prescription for a weight loss medication, which she plans to take to various pharmacies to find one that can fill it.  Medications: Outpatient Medications Prior to Visit  Medication Sig   losartan-hydrochlorothiazide (HYZAAR) 50-12.5 MG tablet TAKE 1 TABLET BY MOUTH DAILY. PLEASE SCHEDULE OFFICE VISIT BEFORE ANY FUTURE REFILL   metFORMIN (GLUCOPHAGE-XR) 750 MG 24 hr tablet Take 1 tablet (750 mg total) by mouth daily with breakfast.   pantoprazole (PROTONIX) 40 MG tablet Take 1 tablet (40 mg total) by mouth 2 (two) times daily before a meal.   rosuvastatin (CRESTOR) 10 MG tablet Take 1 tablet (10 mg total) by mouth daily.   traZODone (DESYREL) 50 MG tablet TAKE HALF TO ONE TABLET BY MOUTH AS NEEDED FOR SLEEP   venlafaxine XR (EFFEXOR-XR) 150 MG 24 hr capsule TAKE 1 CAPSULE BY MOUTH EVERY DAY WITH BREAKFAST   Vitamin D, Ergocalciferol, (DRISDOL) 1.25 MG (50000 UNIT) CAPS capsule Take 1 capsule (50,000 Units total) by mouth every 7 (seven) days.   busPIRone (BUSPAR) 5 MG tablet TAKE 1 TABLET BY MOUTH 3 TIMES DAILY AS NEEDED. (Patient not taking: Reported on 10/20/2023)   No facility-administered medications prior to visit.     Objective    BP 134/72 (BP Location: Right Arm, Patient Position: Sitting, Cuff Size: Large)   Pulse 82   Temp 98.8 F (37.1 C) (Oral)   Resp 16   Ht 5\' 2"  (1.575 m)   Wt 250 lb (113.4 kg)   BMI 45.73  kg/m   Physical Exam Vitals and nursing note reviewed.  Constitutional:      General: She is not in acute distress.    Appearance: Normal appearance. She is obese. She is not ill-appearing, toxic-appearing or diaphoretic.  HENT:     Head: Normocephalic and atraumatic.  Cardiovascular:     Rate and Rhythm: Normal rate and regular rhythm.     Pulses: Normal pulses.     Heart sounds: Normal heart sounds. No murmur heard.    No friction rub. No gallop.  Pulmonary:     Effort: Pulmonary effort is normal. No respiratory distress.     Breath sounds: Normal breath  sounds. No stridor. No wheezing, rhonchi or rales.  Chest:     Chest wall: No tenderness.  Musculoskeletal:        General: No swelling, tenderness, deformity or signs of injury. Normal range of motion.     Right lower leg: 1+ Pitting Edema present.     Left lower leg: 1+ Pitting Edema present.  Skin:    General: Skin is warm and dry.     Capillary Refill: Capillary refill takes less than 2 seconds.     Coloration: Skin is not jaundiced or pale.     Findings: Erythema present. No bruising, lesion or rash.          Comments: Site of healing 'chemical rash' from venous insufficiency and LE edema; site is pink, normothermic, without exudate or pulse changes distal to skin change  Neurological:     General: No focal deficit present.     Mental Status: She is alert and oriented to person, place, and time. Mental status is at baseline.     Cranial Nerves: No cranial nerve deficit.     Sensory: No sensory deficit.     Motor: No weakness.     Coordination: Coordination normal.  Psychiatric:        Mood and Affect: Mood normal.        Behavior: Behavior normal.        Thought Content: Thought content normal.        Judgment: Judgment normal.     No results found for any visits on 10/20/23.  Assessment & Plan     Problem List Items Addressed This Visit   None Lower extremity edema Noted worsening of swelling with pitting edema. No associated pain or signs of cellulitis. Currently on diuretic therapy. -Increase diuretic therapy as tolerated, balancing with potassium levels. -Encourage increased fluid intake and weight management.  Urinary incontinence Reports episodes of sudden, large volume urinary incontinence without precipitating factors such as coughing or sneezing. Episodes are infrequent but significantly impact quality of life. -Refer to urogynecology for further evaluation and management.  Weight management Patient expresses desire for weight loss and requests prescription  for weight loss medication. -Handwritten prescription for Zepbound provided. Patient to check with various pharmacies for availability and cost.  Ulcerative colitis No current complaints. Patient reports constipation would be beneficial due to this condition, when discussing current plan to trial zepbound -Continue current management.  Hypertension Blood pressure controlled at current visit. -Continue current medication (Hyzaar).  Skin integrity Slow healing of skin lesions noted, possibly related to edema vs obesity vs venous insufficiency  -Monitor for signs of infection or worsening condition.  Leilani Merl, FNP, have reviewed all documentation for this visit. The documentation on 10/20/23 for the exam, diagnosis, procedures, and orders are all accurate and complete.  Jacky Kindle, FNP  [facility] [phone] (phone) [phone] (fax)  [facility]

## 2023-10-22 ENCOUNTER — Other Ambulatory Visit: Payer: Self-pay | Admitting: Family Medicine

## 2023-10-22 DIAGNOSIS — I10 Essential (primary) hypertension: Secondary | ICD-10-CM

## 2023-11-23 ENCOUNTER — Inpatient Hospital Stay: Payer: BC Managed Care – PPO

## 2023-11-24 ENCOUNTER — Inpatient Hospital Stay: Payer: BC Managed Care – PPO

## 2023-11-24 ENCOUNTER — Inpatient Hospital Stay: Payer: BC Managed Care – PPO | Admitting: Oncology

## 2023-11-30 ENCOUNTER — Inpatient Hospital Stay: Payer: BC Managed Care – PPO | Attending: Oncology

## 2023-12-01 ENCOUNTER — Encounter: Payer: Self-pay | Admitting: Oncology

## 2023-12-01 ENCOUNTER — Ambulatory Visit: Payer: BC Managed Care – PPO

## 2023-12-01 ENCOUNTER — Inpatient Hospital Stay: Payer: BC Managed Care – PPO | Admitting: Oncology

## 2023-12-03 ENCOUNTER — Ambulatory Visit: Payer: Self-pay | Admitting: *Deleted

## 2023-12-03 ENCOUNTER — Encounter: Payer: Self-pay | Admitting: Family Medicine

## 2023-12-03 ENCOUNTER — Ambulatory Visit: Payer: BC Managed Care – PPO | Admitting: Family Medicine

## 2023-12-03 VITALS — BP 103/70 | HR 72 | Temp 98.6°F | Wt 249.0 lb

## 2023-12-03 DIAGNOSIS — L235 Allergic contact dermatitis due to other chemical products: Secondary | ICD-10-CM | POA: Diagnosis not present

## 2023-12-03 DIAGNOSIS — L03116 Cellulitis of left lower limb: Secondary | ICD-10-CM | POA: Diagnosis not present

## 2023-12-03 MED ORDER — TRIAMCINOLONE ACETONIDE 0.1 % EX CREA
1.0000 | TOPICAL_CREAM | Freq: Two times a day (BID) | CUTANEOUS | 0 refills | Status: AC
Start: 2023-12-03 — End: ?

## 2023-12-03 MED ORDER — SULFAMETHOXAZOLE-TRIMETHOPRIM 800-160 MG PO TABS
1.0000 | ORAL_TABLET | Freq: Two times a day (BID) | ORAL | 0 refills | Status: AC
Start: 2023-12-03 — End: ?

## 2023-12-03 NOTE — Assessment & Plan Note (Addendum)
Pt presents with worsening and spreading erythema on Left anterior calf, which started as an allergic reaction with blisters. Concerns pt has not developed cellulitis to the area given her HPI and physical findings today. Since wound opened, and had high risk of being exposed to more pathogens and given pt's comorbities hx, Bactrim prescribed.   Discussed if affected area has not improve within 48 hours of starting antibiotic, has worsen, or has develop systemic symptoms she should seek care urgently.   If itchiness return and allergic symptoms worsen, may need to be prescribed 6 day course of prednisone 10mg  tablet po tapered.  Messaged pt on my chart regarding antibiotic and educated on being aware blood glucose combination of metformin.

## 2023-12-03 NOTE — Telephone Encounter (Signed)
  Chief Complaint: Left shin wound- red,hot, painful area- larger than patient hand Symptoms: patient has established wound for latex allergy- she states it is significantly worse- large/increasing area of redness, hot to touch and painful Frequency: 3/4 weeks Pertinent Negatives: Patient denies fever Disposition: [] ED /[] Urgent Care (no appt availability in office) / [] Appointment(In office/virtual)/ []  [facility]/ [] Home Care/ [] Refused Recommended Disposition /[] [facility] [facility]/ []  Follow-up with PCP Additional Notes: Call to office- no open appointment- Okey Regal took call.

## 2023-12-03 NOTE — Progress Notes (Signed)
Acute Office Visit  Introduced to nurse practitioner role and practice setting.  All questions answered.  Discussed provider/patient relationship and expectations.   Subjective:     Patient ID: Teresa Pitts, female    DOB: 12/03/1961, 62 y.o.   MRN: [id number]  Chief Complaint  Patient presents with   Wound    Wound on left leg, it has been there for about 3 weeks or longer. It is not getting better and seems  to be redder than usual.     Pt presents for concerns for worsening wound on leg after allergic reaction to latex in socks from three weeks ago. States didn't know socks had latex in elastic band at top of sock, then she developed itchiness on L ankle, which left her with blisters. The blisters opened, but since the wound has not healed and now her Left ankle and front of calf is painful, swollen, and redness is worsening. She is concerned she now has a skin infection, and denies itchiness at affect area today.   She denies fever, chills, rigors, shortness of breath, difficulty breathing, dizziness, palpitations, wheezing, or chest pain. States the wound has not bleed and she has not seen any purulent drainage.   She also states recent new tennis shoes she have a reaction too as while, which has now healed. States has a history of having local allergic reaction to adhesive from bandaids.    Review of Systems  All other systems reviewed and are negative.     Objective:    BP 103/70 (BP Location: Left Arm, Patient Position: Sitting, Cuff Size: Large)   Pulse 72   Temp 98.6 F (37 C) (Oral)   Wt 249 lb (112.9 kg)   SpO2 93%   BMI 45.54 kg/m    Physical Exam Constitutional:      Appearance: Normal appearance. She is obese.  HENT:     Head: Normocephalic.  Eyes:     Pupils: Pupils are equal, round, and reactive to light.  Cardiovascular:     Rate and Rhythm: Normal rate and regular rhythm.     Pulses: Normal pulses.          Dorsalis pedis pulses are 2+ on the right  side and 2+ on the left side.       Posterior tibial pulses are 2+ on the right side and 2+ on the left side.     Heart sounds: Normal heart sounds.  Pulmonary:     Effort: Pulmonary effort is normal. No respiratory distress.     Breath sounds: Normal breath sounds. No stridor. No wheezing, rhonchi or rales.  Chest:     Chest wall: No tenderness.  Musculoskeletal:     Cervical back: Normal range of motion.  Skin:    General: Skin is warm and dry.     Capillary Refill: Capillary refill takes less than 2 seconds.     Findings: Erythema and wound present.          Comments: Left anterior lower leg positive for erythema, edema, tenderness to palpation. Pulses intact. No drainage or bleeding. Erythema has no defined borders.   Left foot - normal findings - no evidence of erythema, edema, pain.   No paresthesia.  Erythema has spread per pt.   R leg and foot normal findings, old scar on top of ankle from previous allergic reaction.  Neurological:     General: No focal deficit present.     Mental Status: She is alert and  oriented to person, place, and time. Mental status is at baseline.  Psychiatric:        Mood and Affect: Mood normal.        Behavior: Behavior normal.        Thought Content: Thought content normal.        Judgment: Judgment normal.      No results found for any visits on 12/03/23.      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 Will prescribe her triamcinolone cream 0.1% for as needed relief for future episodes.       Relevant Medications   triamcinolone cream (KENALOG) 0.1 %     Other   Cellulitis of left ankle - Primary    Pt presents with worsening and spreading erythema on Left anterior calf, which started as an allergic reaction with blisters. Concerns pt has not developed cellulitis to  the area given her HPI and physical findings today. Since wound opened, and had high risk of being exposed to more pathogens and given pt's comorbities hx, Bactrim prescribed.   Discussed if affected area has not improve within 48 hours of starting antibiotic, has worsen, or has develop systemic symptoms she should seek care urgently.   If itchiness return and allergic symptoms worsen, may need to be prescribed 6 day course of prednisone 10mg  tablet po tapered.  Messaged pt on my chart regarding antibiotic and educated on being aware blood glucose combination of metformin.       Relevant Medications   sulfamethoxazole-trimethoprim (BACTRIM DS) 800-160 MG tablet      Meds ordered this encounter  Medications   triamcinolone cream (KENALOG) 0.1 %    Sig: Apply 1 Application topically 2 (two) times daily. Apply to affected area of contact dermatitis.    Dispense:  30 g    Refill:  0   sulfamethoxazole-trimethoprim (BACTRIM DS) 800-160 MG tablet    Sig: Take 1 tablet by mouth 2 (two) times daily.    Dispense:  10 tablet    Refill:  0    Return if symptoms worsen or fail to improve.  I, Sallee Provencal, FNP, have reviewed all documentation for this visit. The documentation on 12/03/23 for the exam, diagnosis, procedures, and orders are all accurate and complete.   Sallee Provencal, FNP

## 2023-12-03 NOTE — Assessment & Plan Note (Signed)
Pt has history of contact dermitis due to latex containing products. Will prescribe her triamcinolone cream 0.1% for as needed relief for future episodes.

## 2023-12-03 NOTE — Telephone Encounter (Signed)
Reason for Disposition  [1] Skin around the wound has become red AND [2] larger than 2 inches (5 cm)  Answer Assessment - Initial Assessment Questions 1. LOCATION: "Where is the wound located?"      L shin- front 2. WOUND APPEARANCE: "What does the wound look like?"      Burn- possible latex allergy to latex 3. SIZE: If redness is present, ask: "What is the size of the red area?" (Inches, centimeters, or compare to size of a coin)      Wound- finger size 4. SPREAD: "What's changed in the last day?"  "Do you see any red streaks coming from the wound?"     Increased redness and heat 5. ONSET: "When did it start to look infected?"      3-4 weeks 6. MECHANISM: "How did the wound start, what was the cause?"     Allergic reaction 7. PAIN: Do you have any pain?"  If Yes, ask: "How bad is the pain?"  (e.g., Scale 1-10; mild, moderate, or severe)    - MILD (1-3): Doesn't interfere with normal activities.     - MODERATE (4-7): Interferes with normal activities or awakens from sleep.    - SEVERE (8-10): Excruciating pain, unable to do any normal activities.       5/10 8. FEVER: "Do you have a fever?" If Yes, ask: "What is your temperature, how was it measured, and when did it start?"     no 9. OTHER SYMPTOMS: "Do you have any other symptoms?" (e.g., shaking chills, weakness, rash elsewhere on body)     Past 2 days- headache  Protocols used: Wound Infection Suspected-A-AH

## 2024-01-22 ENCOUNTER — Other Ambulatory Visit: Payer: Self-pay | Admitting: Family Medicine

## 2024-01-22 ENCOUNTER — Telehealth: Payer: Self-pay | Admitting: Family Medicine

## 2024-01-22 DIAGNOSIS — F4322 Adjustment disorder with anxiety: Secondary | ICD-10-CM

## 2024-01-22 MED ORDER — VENLAFAXINE HCL ER 150 MG PO CP24
ORAL_CAPSULE | ORAL | 0 refills | Status: DC
Start: 2024-01-22 — End: 2024-04-22

## 2024-01-22 NOTE — Telephone Encounter (Signed)
Received [facility] from [facility] needing refills on Venlafaxine HCL ER 150 mg. Cap #90

## 2024-01-27 ENCOUNTER — Other Ambulatory Visit: Payer: Self-pay | Admitting: Family Medicine

## 2024-01-27 ENCOUNTER — Telehealth: Payer: Self-pay | Admitting: Family Medicine

## 2024-01-27 DIAGNOSIS — E7800 Pure hypercholesterolemia, unspecified: Secondary | ICD-10-CM

## 2024-01-27 DIAGNOSIS — I2510 Atherosclerotic heart disease of native coronary artery without angina pectoris: Secondary | ICD-10-CM

## 2024-01-27 MED ORDER — ROSUVASTATIN CALCIUM 10 MG PO TABS
10.0000 mg | ORAL_TABLET | Freq: Every day | ORAL | 3 refills | Status: DC
Start: 2024-01-27 — End: 2024-07-04

## 2024-01-27 NOTE — Telephone Encounter (Signed)
[facility]ill rosuvastatin (CRESTOR) 10 MG tablet  Please advise

## 2024-04-21 ENCOUNTER — Other Ambulatory Visit: Payer: Self-pay | Admitting: Family Medicine

## 2024-04-21 DIAGNOSIS — F4322 Adjustment disorder with anxiety: Secondary | ICD-10-CM

## 2024-04-22 NOTE — Telephone Encounter (Signed)
 Courtesy refill. Requested Prescriptions  Pending Prescriptions Disp Refills   venlafaxine  XR (EFFEXOR -XR) 150 MG 24 hr capsule [Pharmacy Med Name: VENLAFAXINE  HCL ER 150 MG CAP] 90 capsule 0    Sig: TAKE 1 CAPSULE BY MOUTH EVERY DAY WITH BREAKFAST     Psychiatry: Antidepressants - SNRI - desvenlafaxine & venlafaxine  Failed - 04/22/2024  8:09 AM      Failed - Valid encounter within last 6 months    Recent Outpatient Visits   None            Failed - Lipid Panel in normal range within the last 12 months    Cholesterol, Total  Date Value Ref Range Status  09/23/2023 186 100 - 199 mg/dL Final   LDL Chol Calc (NIH)  Date Value Ref Range Status  09/23/2023 87 0 - 99 mg/dL Final   HDL  Date Value Ref Range Status  09/23/2023 84 >39 mg/dL Final   Triglycerides  Date Value Ref Range Status  09/23/2023 83 0 - 149 mg/dL Final         Passed - Cr in normal range and within 360 days    Creatinine, Ser  Date Value Ref Range Status  09/22/2023 0.84 0.44 - 1.00 mg/dL Final         Passed - Last BP in normal range    BP Readings from Last 1 Encounters:  12/03/23 103/70

## 2024-07-03 ENCOUNTER — Other Ambulatory Visit: Payer: Self-pay | Admitting: Family Medicine

## 2024-07-03 DIAGNOSIS — I2510 Atherosclerotic heart disease of native coronary artery without angina pectoris: Secondary | ICD-10-CM

## 2024-07-03 DIAGNOSIS — E7800 Pure hypercholesterolemia, unspecified: Secondary | ICD-10-CM

## 2024-07-18 ENCOUNTER — Other Ambulatory Visit: Payer: Self-pay | Admitting: Family Medicine

## 2024-07-18 DIAGNOSIS — F4322 Adjustment disorder with anxiety: Secondary | ICD-10-CM

## 2024-09-08 ENCOUNTER — Other Ambulatory Visit: Payer: Self-pay | Admitting: Family Medicine

## 2024-09-08 DIAGNOSIS — F4322 Adjustment disorder with anxiety: Secondary | ICD-10-CM

## 2024-10-24 ENCOUNTER — Other Ambulatory Visit: Payer: Self-pay

## 2024-10-24 ENCOUNTER — Telehealth: Payer: Self-pay | Admitting: Family Medicine

## 2024-10-24 DIAGNOSIS — I10 Essential (primary) hypertension: Secondary | ICD-10-CM

## 2024-10-24 DIAGNOSIS — F5102 Adjustment insomnia: Secondary | ICD-10-CM

## 2024-10-24 MED ORDER — LOSARTAN POTASSIUM-HCTZ 50-12.5 MG PO TABS
1.0000 | ORAL_TABLET | Freq: Every day | ORAL | 0 refills | Status: AC
Start: 2024-10-24 — End: ?

## 2024-10-24 NOTE — Telephone Encounter (Signed)
 LOV-12/03/2023 NOV- None LRF- 10/20/2023 traZODone  (DESYREL ) 50 MG tablet [[id number]]   Order Details Dose, Route, Frequency: As Directed  Dispense Quantity: 90 tablet Refills: 3   Duration: -- Dispense As Written: No        Sig: TAKE HALF TO ONE TABLET BY MOUTH AS NEEDED FOR SLEEP       Start Date: 10/20/23 End Date: --  Written Date: 10/20/23 Expiration Date: 10/19/24

## 2024-10-24 NOTE — Telephone Encounter (Addendum)
 [facility] refill request for the following medications:  losartan -hydrochlorothiazide (HYZAAR) 50-12.5 MG tablet   traZODone  (DESYREL ) 50 MG tablet    Please advise.

## 2024-10-24 NOTE — Telephone Encounter (Signed)
 Rx sent to pharmacy, trazodone  sent to pcp for approval.

## 2024-10-25 ENCOUNTER — Other Ambulatory Visit: Payer: Self-pay

## 2024-10-25 ENCOUNTER — Telehealth: Payer: Self-pay | Admitting: Family Medicine

## 2024-10-25 DIAGNOSIS — F4322 Adjustment disorder with anxiety: Secondary | ICD-10-CM

## 2024-10-25 MED ORDER — TRAZODONE HCL 50 MG PO TABS: ORAL_TABLET | ORAL | 0 refills | Status: DC

## 2024-10-25 MED ORDER — BUSPIRONE HCL 5 MG PO TABS: 5.0000 mg | ORAL_TABLET | Freq: Three times a day (TID) | ORAL | 0 refills | Status: AC | PRN

## 2024-10-25 NOTE — Telephone Encounter (Signed)
 Courtesy refill for pt. A message has been sent to pt through Mychart

## 2024-10-25 NOTE — Telephone Encounter (Signed)
 [facility] refill request for the following medications:   busPIRone (BUSPAR) 5 MG tablet    Please advise.

## 2024-11-28 ENCOUNTER — Telehealth: Payer: Self-pay | Admitting: Family Medicine

## 2024-11-28 DIAGNOSIS — F4322 Adjustment disorder with anxiety: Secondary | ICD-10-CM

## 2024-11-28 NOTE — Telephone Encounter (Signed)
 [facility] refill request for the following medications:   busPIRone (BUSPAR) 5 MG tablet    Please advise.

## 2024-11-28 NOTE — Telephone Encounter (Signed)
 Converted to rx refill

## 2024-12-01 ENCOUNTER — Telehealth: Payer: Self-pay | Admitting: Family Medicine

## 2024-12-01 NOTE — Telephone Encounter (Signed)
 Pt needs appt.

## 2024-12-01 NOTE — Telephone Encounter (Signed)
 Left VM that she needs an OV to get medication refills until she can establish with another provider.

## 2024-12-01 NOTE — Telephone Encounter (Signed)
 [facility] Buspirone  HCL 5 mg. #90

## 2024-12-14 ENCOUNTER — Other Ambulatory Visit: Payer: Self-pay

## 2024-12-14 ENCOUNTER — Telehealth: Payer: Self-pay | Admitting: Family Medicine

## 2024-12-14 DIAGNOSIS — F4322 Adjustment disorder with anxiety: Secondary | ICD-10-CM

## 2024-12-14 NOTE — Telephone Encounter (Signed)
[facility] refill request for the following medications:  venlafaxine XR (EFFEXOR-XR) 150 MG 24 hr capsule   Please advise.  

## 2024-12-14 NOTE — Telephone Encounter (Signed)
 Converted

## 2024-12-26 ENCOUNTER — Other Ambulatory Visit: Payer: Self-pay | Admitting: Family Medicine

## 2024-12-26 DIAGNOSIS — F5102 Adjustment insomnia: Secondary | ICD-10-CM

## 2024-12-26 DIAGNOSIS — F4322 Adjustment disorder with anxiety: Secondary | ICD-10-CM

## 2024-12-26 NOTE — Telephone Encounter (Unsigned)
 Copied from CRM #[id number]. Topic: Clinical - Medication Refill >> Dec 26, 2024 12:28 PM Delon HERO wrote: Medication: venlafaxine  XR (EFFEXOR -XR) 150 MG 24 hr capsule [[id number]]  traZODone  (DESYREL ) 50 MG tablet [[id number]]   Has the patient contacted their pharmacy? Yes (Agent: If no, request that the patient contact the pharmacy for the refill. If patient does not wish to contact the pharmacy document the reason why and proceed with request.) (Agent: If yes, when and what did the pharmacy advise?)  This is the patient's preferred pharmacy:  [id number] [place] [place] - [address] [place] [address] Phone: [phone] Fax: [phone]  Is this the correct pharmacy for this prescription? Yes If no, delete pharmacy and type the correct one.   Has the prescription been filled recently? Yes  Is the patient out of the medication? Yes  Has the patient been seen for an appointment in the last year OR does the patient have an upcoming appointment? Yes  Can we respond through MyChart? Yes  Agent: Please be advised that Rx refills may take up to 3 business days. We ask that you follow-up with your pharmacy.

## 2024-12-27 ENCOUNTER — Telehealth: Payer: Self-pay | Admitting: Family Medicine

## 2024-12-27 ENCOUNTER — Other Ambulatory Visit: Payer: Self-pay

## 2024-12-27 DIAGNOSIS — F5102 Adjustment insomnia: Secondary | ICD-10-CM

## 2024-12-27 MED ORDER — TRAZODONE HCL 50 MG PO TABS: ORAL_TABLET | ORAL | 0 refills | Status: AC

## 2024-12-27 NOTE — Telephone Encounter (Signed)
 Requested medication (s) are due for refill today: Yes  Requested medication (s) are on the active medication list: Yes  Last refill:  09/08/24  Future visit scheduled: No  Notes to clinic:  Unable to refill per protocol, appointment needed, no updated labs.     Requested Prescriptions  Pending Prescriptions Disp Refills   venlafaxine  XR (EFFEXOR -XR) 150 MG 24 hr capsule 90 capsule 0    Sig: TAKE 1 CAPSULE BY MOUTH EVERY DAY WITH BREAKFAST     Psychiatry: Antidepressants - SNRI - desvenlafaxine & venlafaxine  Failed - 12/27/2024  5:09 PM      Failed - Cr in normal range and within 360 days    Creatinine, Ser  Date Value Ref Range Status  09/22/2023 0.84 0.44 - 1.00 mg/dL Final         Failed - Valid encounter within last 6 months    Recent Outpatient Visits   None            Failed - Lipid Panel in normal range within the last 12 months    Cholesterol, Total  Date Value Ref Range Status  09/23/2023 186 100 - 199 mg/dL Final   LDL Chol Calc (NIH)  Date Value Ref Range Status  09/23/2023 87 0 - 99 mg/dL Final   HDL  Date Value Ref Range Status  09/23/2023 84 >39 mg/dL Final   Triglycerides  Date Value Ref Range Status  09/23/2023 83 0 - 149 mg/dL Final         Passed - Last BP in normal range    BP Readings from Last 1 Encounters:  12/03/23 103/70

## 2024-12-27 NOTE — Telephone Encounter (Signed)
 Patient called, left VM to return the call to the office to schedule an OV for follow up. Last OV 12/03/23, may need a physical scheduled.  Trazodone  refilled today in a separate encounter.

## 2024-12-27 NOTE — Telephone Encounter (Signed)
[facility] for the following medications:  traZODone (DESYREL) 50 MG tablet   Please advise.  

## 2024-12-28 ENCOUNTER — Telehealth: Payer: Self-pay

## 2024-12-28 NOTE — Telephone Encounter (Signed)
 Copied from CRM #[id number]. Topic: Clinical - Prescription Issue >> Dec 28, 2024  4:10 PM Delon T wrote: Reason for CRM: venlafaxine  XR (EFFEXOR -XR) 150 MG 24 hr capsule - made appt and need refill before going out of town-  [phone]

## 2024-12-30 ENCOUNTER — Other Ambulatory Visit: Payer: Self-pay

## 2024-12-30 DIAGNOSIS — F4322 Adjustment disorder with anxiety: Secondary | ICD-10-CM

## 2024-12-30 MED ORDER — VENLAFAXINE HCL ER 150 MG PO CP24: ORAL_CAPSULE | ORAL | 0 refills | Status: AC

## 2024-12-30 NOTE — Telephone Encounter (Signed)
 Prescription sent

## 2025-01-09 ENCOUNTER — Other Ambulatory Visit: Payer: Self-pay

## 2025-01-09 ENCOUNTER — Telehealth: Payer: Self-pay | Admitting: Family Medicine

## 2025-01-09 DIAGNOSIS — I2510 Atherosclerotic heart disease of native coronary artery without angina pectoris: Secondary | ICD-10-CM

## 2025-01-09 DIAGNOSIS — E7800 Pure hypercholesterolemia, unspecified: Secondary | ICD-10-CM

## 2025-01-09 MED ORDER — ROSUVASTATIN CALCIUM 10 MG PO TABS: 10.0000 mg | ORAL_TABLET | Freq: Every day | ORAL | 0 refills | Status: AC

## 2025-01-09 NOTE — Telephone Encounter (Signed)
 [facility] refill request for the following medications:   rosuvastatin (CRESTOR) 10 MG tablet     Please advise.

## 2025-01-17 ENCOUNTER — Ambulatory Visit

## 2025-01-23 ENCOUNTER — Ambulatory Visit

## 2025-01-31 ENCOUNTER — Telehealth: Payer: Self-pay | Admitting: Family Medicine

## 2025-01-31 DIAGNOSIS — I10 Essential (primary) hypertension: Secondary | ICD-10-CM

## 2025-01-31 NOTE — Telephone Encounter (Signed)
 Last seen in office 12/03/23 Please be seen in office for any further refills. Patient also needs to schedule TOC with new provider as Teresa Pitts is no longer a provider at [facility]  E2C2 - Please advise office visit is needed for any refills as she was last seen in office on 12/03/23 and is to be seen every 3-6 months for hypertension. Please schedule an office visit for medication refills with anyone in office and another appointment for [facility] with new provider

## 2025-01-31 NOTE — Telephone Encounter (Signed)
 [facility] refill request for the following medications:   losartan -hydrochlorothiazide (HYZAAR) 50-12.5 MG tablet     Please advise.
